# Patient Record
Sex: Female | Born: 1945
Health system: Southern US, Community
[De-identification: ages and names within clinical notes are randomized; demographics above are authoritative.]

## PROBLEM LIST (undated history)

## (undated) DIAGNOSIS — J189 Pneumonia, unspecified organism: Secondary | ICD-10-CM

## (undated) DIAGNOSIS — M1990 Unspecified osteoarthritis, unspecified site: Secondary | ICD-10-CM

## (undated) DIAGNOSIS — F32A Depression, unspecified: Secondary | ICD-10-CM

## (undated) DIAGNOSIS — C801 Malignant (primary) neoplasm, unspecified: Secondary | ICD-10-CM

## (undated) DIAGNOSIS — J45909 Unspecified asthma, uncomplicated: Secondary | ICD-10-CM

## (undated) DIAGNOSIS — R011 Cardiac murmur, unspecified: Secondary | ICD-10-CM

## (undated) HISTORY — PX: APPENDECTOMY: SHX54

## (undated) HISTORY — PX: BACK SURGERY: SHX140

## (undated) HISTORY — PX: OTHER SURGICAL HISTORY: SHX169

## (undated) HISTORY — PX: FRACTURE SURGERY: SHX138

## (undated) HISTORY — PX: FOOT SURGERY: SHX648

---

## 1996-09-12 HISTORY — PX: SEPTOPLASTY: SUR1290

## 2005-09-12 HISTORY — PX: KNEE SURGERY: SHX244

## 2013-09-16 DIAGNOSIS — M7750 Other enthesopathy of unspecified foot: Secondary | ICD-10-CM | POA: Diagnosis not present

## 2013-09-16 DIAGNOSIS — M2040 Other hammer toe(s) (acquired), unspecified foot: Secondary | ICD-10-CM | POA: Diagnosis not present

## 2013-09-16 DIAGNOSIS — M779 Enthesopathy, unspecified: Secondary | ICD-10-CM | POA: Diagnosis not present

## 2013-09-16 DIAGNOSIS — M7660 Achilles tendinitis, unspecified leg: Secondary | ICD-10-CM | POA: Diagnosis not present

## 2013-09-16 DIAGNOSIS — L738 Other specified follicular disorders: Secondary | ICD-10-CM | POA: Diagnosis not present

## 2013-09-18 DIAGNOSIS — IMO0002 Reserved for concepts with insufficient information to code with codable children: Secondary | ICD-10-CM | POA: Diagnosis not present

## 2013-09-19 DIAGNOSIS — M7750 Other enthesopathy of unspecified foot: Secondary | ICD-10-CM | POA: Diagnosis not present

## 2013-09-19 DIAGNOSIS — M779 Enthesopathy, unspecified: Secondary | ICD-10-CM | POA: Diagnosis not present

## 2013-09-23 DIAGNOSIS — M2040 Other hammer toe(s) (acquired), unspecified foot: Secondary | ICD-10-CM | POA: Diagnosis not present

## 2013-09-23 DIAGNOSIS — M7750 Other enthesopathy of unspecified foot: Secondary | ICD-10-CM | POA: Diagnosis not present

## 2013-09-23 DIAGNOSIS — M779 Enthesopathy, unspecified: Secondary | ICD-10-CM | POA: Diagnosis not present

## 2013-09-23 DIAGNOSIS — G5760 Lesion of plantar nerve, unspecified lower limb: Secondary | ICD-10-CM | POA: Diagnosis not present

## 2013-09-25 DIAGNOSIS — IMO0002 Reserved for concepts with insufficient information to code with codable children: Secondary | ICD-10-CM | POA: Diagnosis not present

## 2013-09-26 DIAGNOSIS — M542 Cervicalgia: Secondary | ICD-10-CM | POA: Diagnosis not present

## 2013-09-26 DIAGNOSIS — Z7182 Exercise counseling: Secondary | ICD-10-CM | POA: Diagnosis not present

## 2013-09-26 DIAGNOSIS — G248 Other dystonia: Secondary | ICD-10-CM | POA: Diagnosis not present

## 2013-09-26 DIAGNOSIS — M5412 Radiculopathy, cervical region: Secondary | ICD-10-CM | POA: Diagnosis not present

## 2013-09-26 DIAGNOSIS — Z79899 Other long term (current) drug therapy: Secondary | ICD-10-CM | POA: Diagnosis not present

## 2013-10-02 DIAGNOSIS — IMO0002 Reserved for concepts with insufficient information to code with codable children: Secondary | ICD-10-CM | POA: Diagnosis not present

## 2013-10-09 DIAGNOSIS — IMO0002 Reserved for concepts with insufficient information to code with codable children: Secondary | ICD-10-CM | POA: Diagnosis not present

## 2013-10-09 DIAGNOSIS — M4802 Spinal stenosis, cervical region: Secondary | ICD-10-CM | POA: Diagnosis not present

## 2013-10-14 DIAGNOSIS — M7750 Other enthesopathy of unspecified foot: Secondary | ICD-10-CM | POA: Diagnosis not present

## 2013-10-24 DIAGNOSIS — M5412 Radiculopathy, cervical region: Secondary | ICD-10-CM | POA: Diagnosis not present

## 2013-10-24 DIAGNOSIS — M542 Cervicalgia: Secondary | ICD-10-CM | POA: Diagnosis not present

## 2013-10-28 DIAGNOSIS — M7750 Other enthesopathy of unspecified foot: Secondary | ICD-10-CM | POA: Diagnosis not present

## 2013-11-05 DIAGNOSIS — C4491 Basal cell carcinoma of skin, unspecified: Secondary | ICD-10-CM | POA: Diagnosis not present

## 2013-11-05 DIAGNOSIS — Z1231 Encounter for screening mammogram for malignant neoplasm of breast: Secondary | ICD-10-CM | POA: Diagnosis not present

## 2013-11-05 DIAGNOSIS — I10 Essential (primary) hypertension: Secondary | ICD-10-CM | POA: Diagnosis not present

## 2013-11-05 DIAGNOSIS — J309 Allergic rhinitis, unspecified: Secondary | ICD-10-CM | POA: Diagnosis not present

## 2013-11-05 DIAGNOSIS — E669 Obesity, unspecified: Secondary | ICD-10-CM | POA: Diagnosis not present

## 2013-11-05 DIAGNOSIS — Z01818 Encounter for other preprocedural examination: Secondary | ICD-10-CM | POA: Diagnosis not present

## 2013-11-06 DIAGNOSIS — Z01818 Encounter for other preprocedural examination: Secondary | ICD-10-CM | POA: Diagnosis not present

## 2013-11-07 DIAGNOSIS — R9431 Abnormal electrocardiogram [ECG] [EKG]: Secondary | ICD-10-CM | POA: Diagnosis not present

## 2013-11-07 DIAGNOSIS — Z01810 Encounter for preprocedural cardiovascular examination: Secondary | ICD-10-CM | POA: Diagnosis not present

## 2013-11-07 DIAGNOSIS — I10 Essential (primary) hypertension: Secondary | ICD-10-CM | POA: Diagnosis not present

## 2013-11-11 DIAGNOSIS — G5760 Lesion of plantar nerve, unspecified lower limb: Secondary | ICD-10-CM | POA: Diagnosis not present

## 2013-11-11 DIAGNOSIS — M7750 Other enthesopathy of unspecified foot: Secondary | ICD-10-CM | POA: Diagnosis not present

## 2013-11-11 DIAGNOSIS — M779 Enthesopathy, unspecified: Secondary | ICD-10-CM | POA: Diagnosis not present

## 2013-11-11 DIAGNOSIS — M21969 Unspecified acquired deformity of unspecified lower leg: Secondary | ICD-10-CM | POA: Diagnosis not present

## 2013-11-12 DIAGNOSIS — L570 Actinic keratosis: Secondary | ICD-10-CM | POA: Diagnosis not present

## 2013-11-12 DIAGNOSIS — L578 Other skin changes due to chronic exposure to nonionizing radiation: Secondary | ICD-10-CM | POA: Diagnosis not present

## 2013-11-12 DIAGNOSIS — L565 Disseminated superficial actinic porokeratosis (DSAP): Secondary | ICD-10-CM | POA: Diagnosis not present

## 2013-11-12 DIAGNOSIS — Z85828 Personal history of other malignant neoplasm of skin: Secondary | ICD-10-CM | POA: Diagnosis not present

## 2013-11-13 DIAGNOSIS — M948X9 Other specified disorders of cartilage, unspecified sites: Secondary | ICD-10-CM | POA: Diagnosis not present

## 2013-11-13 DIAGNOSIS — M79609 Pain in unspecified limb: Secondary | ICD-10-CM | POA: Diagnosis not present

## 2013-11-13 DIAGNOSIS — M21619 Bunion of unspecified foot: Secondary | ICD-10-CM | POA: Diagnosis not present

## 2013-11-13 DIAGNOSIS — M21969 Unspecified acquired deformity of unspecified lower leg: Secondary | ICD-10-CM | POA: Diagnosis not present

## 2013-11-13 DIAGNOSIS — M898X9 Other specified disorders of bone, unspecified site: Secondary | ICD-10-CM | POA: Diagnosis not present

## 2013-11-13 DIAGNOSIS — G5790 Unspecified mononeuropathy of unspecified lower limb: Secondary | ICD-10-CM | POA: Diagnosis not present

## 2013-11-13 DIAGNOSIS — D2120 Benign neoplasm of connective and other soft tissue of unspecified lower limb, including hip: Secondary | ICD-10-CM | POA: Diagnosis not present

## 2013-11-13 DIAGNOSIS — G5760 Lesion of plantar nerve, unspecified lower limb: Secondary | ICD-10-CM | POA: Diagnosis not present

## 2013-11-13 DIAGNOSIS — M2040 Other hammer toe(s) (acquired), unspecified foot: Secondary | ICD-10-CM | POA: Diagnosis not present

## 2013-11-21 DIAGNOSIS — Z7182 Exercise counseling: Secondary | ICD-10-CM | POA: Diagnosis not present

## 2013-11-21 DIAGNOSIS — M542 Cervicalgia: Secondary | ICD-10-CM | POA: Diagnosis not present

## 2013-11-21 DIAGNOSIS — IMO0001 Reserved for inherently not codable concepts without codable children: Secondary | ICD-10-CM | POA: Diagnosis not present

## 2013-11-21 DIAGNOSIS — G248 Other dystonia: Secondary | ICD-10-CM | POA: Diagnosis not present

## 2013-11-21 DIAGNOSIS — M5412 Radiculopathy, cervical region: Secondary | ICD-10-CM | POA: Diagnosis not present

## 2013-12-19 DIAGNOSIS — IMO0001 Reserved for inherently not codable concepts without codable children: Secondary | ICD-10-CM | POA: Diagnosis not present

## 2013-12-19 DIAGNOSIS — M5412 Radiculopathy, cervical region: Secondary | ICD-10-CM | POA: Diagnosis not present

## 2013-12-19 DIAGNOSIS — G248 Other dystonia: Secondary | ICD-10-CM | POA: Diagnosis not present

## 2013-12-19 DIAGNOSIS — M542 Cervicalgia: Secondary | ICD-10-CM | POA: Diagnosis not present

## 2013-12-23 DIAGNOSIS — E669 Obesity, unspecified: Secondary | ICD-10-CM | POA: Diagnosis not present

## 2013-12-23 DIAGNOSIS — Z1231 Encounter for screening mammogram for malignant neoplasm of breast: Secondary | ICD-10-CM | POA: Diagnosis not present

## 2013-12-23 DIAGNOSIS — I10 Essential (primary) hypertension: Secondary | ICD-10-CM | POA: Diagnosis not present

## 2013-12-23 DIAGNOSIS — C4491 Basal cell carcinoma of skin, unspecified: Secondary | ICD-10-CM | POA: Diagnosis not present

## 2013-12-26 DIAGNOSIS — M25549 Pain in joints of unspecified hand: Secondary | ICD-10-CM | POA: Diagnosis not present

## 2013-12-26 DIAGNOSIS — M25539 Pain in unspecified wrist: Secondary | ICD-10-CM | POA: Diagnosis not present

## 2013-12-26 DIAGNOSIS — IMO0001 Reserved for inherently not codable concepts without codable children: Secondary | ICD-10-CM | POA: Diagnosis not present

## 2013-12-31 DIAGNOSIS — IMO0001 Reserved for inherently not codable concepts without codable children: Secondary | ICD-10-CM | POA: Diagnosis not present

## 2013-12-31 DIAGNOSIS — M25549 Pain in joints of unspecified hand: Secondary | ICD-10-CM | POA: Diagnosis not present

## 2013-12-31 DIAGNOSIS — M25539 Pain in unspecified wrist: Secondary | ICD-10-CM | POA: Diagnosis not present

## 2014-01-02 DIAGNOSIS — M25549 Pain in joints of unspecified hand: Secondary | ICD-10-CM | POA: Diagnosis not present

## 2014-01-02 DIAGNOSIS — M25539 Pain in unspecified wrist: Secondary | ICD-10-CM | POA: Diagnosis not present

## 2014-01-02 DIAGNOSIS — IMO0001 Reserved for inherently not codable concepts without codable children: Secondary | ICD-10-CM | POA: Diagnosis not present

## 2014-01-07 DIAGNOSIS — IMO0001 Reserved for inherently not codable concepts without codable children: Secondary | ICD-10-CM | POA: Diagnosis not present

## 2014-01-07 DIAGNOSIS — M25539 Pain in unspecified wrist: Secondary | ICD-10-CM | POA: Diagnosis not present

## 2014-01-07 DIAGNOSIS — M25549 Pain in joints of unspecified hand: Secondary | ICD-10-CM | POA: Diagnosis not present

## 2014-01-09 DIAGNOSIS — M25539 Pain in unspecified wrist: Secondary | ICD-10-CM | POA: Diagnosis not present

## 2014-01-09 DIAGNOSIS — IMO0001 Reserved for inherently not codable concepts without codable children: Secondary | ICD-10-CM | POA: Diagnosis not present

## 2014-01-09 DIAGNOSIS — M25549 Pain in joints of unspecified hand: Secondary | ICD-10-CM | POA: Diagnosis not present

## 2014-01-14 DIAGNOSIS — M79609 Pain in unspecified limb: Secondary | ICD-10-CM | POA: Diagnosis not present

## 2014-01-14 DIAGNOSIS — M25539 Pain in unspecified wrist: Secondary | ICD-10-CM | POA: Diagnosis not present

## 2014-01-16 DIAGNOSIS — M79609 Pain in unspecified limb: Secondary | ICD-10-CM | POA: Diagnosis not present

## 2014-01-16 DIAGNOSIS — M25539 Pain in unspecified wrist: Secondary | ICD-10-CM | POA: Diagnosis not present

## 2014-01-21 DIAGNOSIS — M79609 Pain in unspecified limb: Secondary | ICD-10-CM | POA: Diagnosis not present

## 2014-01-21 DIAGNOSIS — M25539 Pain in unspecified wrist: Secondary | ICD-10-CM | POA: Diagnosis not present

## 2014-01-23 DIAGNOSIS — M79609 Pain in unspecified limb: Secondary | ICD-10-CM | POA: Diagnosis not present

## 2014-01-23 DIAGNOSIS — M25539 Pain in unspecified wrist: Secondary | ICD-10-CM | POA: Diagnosis not present

## 2014-01-28 DIAGNOSIS — M79609 Pain in unspecified limb: Secondary | ICD-10-CM | POA: Diagnosis not present

## 2014-01-28 DIAGNOSIS — M25539 Pain in unspecified wrist: Secondary | ICD-10-CM | POA: Diagnosis not present

## 2014-01-30 DIAGNOSIS — M542 Cervicalgia: Secondary | ICD-10-CM | POA: Diagnosis not present

## 2014-01-30 DIAGNOSIS — Z7182 Exercise counseling: Secondary | ICD-10-CM | POA: Diagnosis not present

## 2014-01-30 DIAGNOSIS — G5600 Carpal tunnel syndrome, unspecified upper limb: Secondary | ICD-10-CM | POA: Diagnosis not present

## 2014-01-30 DIAGNOSIS — M5412 Radiculopathy, cervical region: Secondary | ICD-10-CM | POA: Diagnosis not present

## 2014-01-30 DIAGNOSIS — M25519 Pain in unspecified shoulder: Secondary | ICD-10-CM | POA: Diagnosis not present

## 2014-02-11 DIAGNOSIS — M549 Dorsalgia, unspecified: Secondary | ICD-10-CM | POA: Diagnosis not present

## 2014-02-11 DIAGNOSIS — IMO0001 Reserved for inherently not codable concepts without codable children: Secondary | ICD-10-CM | POA: Diagnosis not present

## 2014-02-11 DIAGNOSIS — M7750 Other enthesopathy of unspecified foot: Secondary | ICD-10-CM | POA: Diagnosis not present

## 2014-02-11 DIAGNOSIS — M25519 Pain in unspecified shoulder: Secondary | ICD-10-CM | POA: Diagnosis not present

## 2014-02-11 DIAGNOSIS — M542 Cervicalgia: Secondary | ICD-10-CM | POA: Diagnosis not present

## 2014-02-12 DIAGNOSIS — M25519 Pain in unspecified shoulder: Secondary | ICD-10-CM | POA: Diagnosis not present

## 2014-02-12 DIAGNOSIS — M25819 Other specified joint disorders, unspecified shoulder: Secondary | ICD-10-CM | POA: Diagnosis not present

## 2014-02-13 DIAGNOSIS — M549 Dorsalgia, unspecified: Secondary | ICD-10-CM | POA: Diagnosis not present

## 2014-02-13 DIAGNOSIS — M7750 Other enthesopathy of unspecified foot: Secondary | ICD-10-CM | POA: Diagnosis not present

## 2014-02-13 DIAGNOSIS — M25519 Pain in unspecified shoulder: Secondary | ICD-10-CM | POA: Diagnosis not present

## 2014-02-13 DIAGNOSIS — IMO0001 Reserved for inherently not codable concepts without codable children: Secondary | ICD-10-CM | POA: Diagnosis not present

## 2014-02-13 DIAGNOSIS — M542 Cervicalgia: Secondary | ICD-10-CM | POA: Diagnosis not present

## 2014-02-18 DIAGNOSIS — M542 Cervicalgia: Secondary | ICD-10-CM | POA: Diagnosis not present

## 2014-02-18 DIAGNOSIS — Z7182 Exercise counseling: Secondary | ICD-10-CM | POA: Diagnosis not present

## 2014-02-18 DIAGNOSIS — M25519 Pain in unspecified shoulder: Secondary | ICD-10-CM | POA: Diagnosis not present

## 2014-02-18 DIAGNOSIS — G5600 Carpal tunnel syndrome, unspecified upper limb: Secondary | ICD-10-CM | POA: Diagnosis not present

## 2014-02-18 DIAGNOSIS — M13819 Other specified arthritis, unspecified shoulder: Secondary | ICD-10-CM | POA: Diagnosis not present

## 2014-02-18 DIAGNOSIS — M7750 Other enthesopathy of unspecified foot: Secondary | ICD-10-CM | POA: Diagnosis not present

## 2014-02-19 DIAGNOSIS — M25519 Pain in unspecified shoulder: Secondary | ICD-10-CM | POA: Diagnosis not present

## 2014-02-19 DIAGNOSIS — M549 Dorsalgia, unspecified: Secondary | ICD-10-CM | POA: Diagnosis not present

## 2014-02-19 DIAGNOSIS — IMO0001 Reserved for inherently not codable concepts without codable children: Secondary | ICD-10-CM | POA: Diagnosis not present

## 2014-02-19 DIAGNOSIS — M542 Cervicalgia: Secondary | ICD-10-CM | POA: Diagnosis not present

## 2014-02-25 DIAGNOSIS — IMO0001 Reserved for inherently not codable concepts without codable children: Secondary | ICD-10-CM | POA: Diagnosis not present

## 2014-02-25 DIAGNOSIS — M542 Cervicalgia: Secondary | ICD-10-CM | POA: Diagnosis not present

## 2014-02-25 DIAGNOSIS — M25519 Pain in unspecified shoulder: Secondary | ICD-10-CM | POA: Diagnosis not present

## 2014-02-25 DIAGNOSIS — M7750 Other enthesopathy of unspecified foot: Secondary | ICD-10-CM | POA: Diagnosis not present

## 2014-02-25 DIAGNOSIS — M549 Dorsalgia, unspecified: Secondary | ICD-10-CM | POA: Diagnosis not present

## 2014-02-26 DIAGNOSIS — M542 Cervicalgia: Secondary | ICD-10-CM | POA: Diagnosis not present

## 2014-02-26 DIAGNOSIS — M25519 Pain in unspecified shoulder: Secondary | ICD-10-CM | POA: Diagnosis not present

## 2014-02-26 DIAGNOSIS — IMO0001 Reserved for inherently not codable concepts without codable children: Secondary | ICD-10-CM | POA: Diagnosis not present

## 2014-02-26 DIAGNOSIS — M549 Dorsalgia, unspecified: Secondary | ICD-10-CM | POA: Diagnosis not present

## 2014-02-27 DIAGNOSIS — M7750 Other enthesopathy of unspecified foot: Secondary | ICD-10-CM | POA: Diagnosis not present

## 2014-02-27 DIAGNOSIS — M549 Dorsalgia, unspecified: Secondary | ICD-10-CM | POA: Diagnosis not present

## 2014-02-27 DIAGNOSIS — M542 Cervicalgia: Secondary | ICD-10-CM | POA: Diagnosis not present

## 2014-02-27 DIAGNOSIS — IMO0001 Reserved for inherently not codable concepts without codable children: Secondary | ICD-10-CM | POA: Diagnosis not present

## 2014-02-27 DIAGNOSIS — M25519 Pain in unspecified shoulder: Secondary | ICD-10-CM | POA: Diagnosis not present

## 2014-02-28 DIAGNOSIS — M542 Cervicalgia: Secondary | ICD-10-CM | POA: Diagnosis not present

## 2014-02-28 DIAGNOSIS — M25519 Pain in unspecified shoulder: Secondary | ICD-10-CM | POA: Diagnosis not present

## 2014-02-28 DIAGNOSIS — IMO0001 Reserved for inherently not codable concepts without codable children: Secondary | ICD-10-CM | POA: Diagnosis not present

## 2014-02-28 DIAGNOSIS — M549 Dorsalgia, unspecified: Secondary | ICD-10-CM | POA: Diagnosis not present

## 2014-03-04 DIAGNOSIS — M7750 Other enthesopathy of unspecified foot: Secondary | ICD-10-CM | POA: Diagnosis not present

## 2014-03-05 DIAGNOSIS — M542 Cervicalgia: Secondary | ICD-10-CM | POA: Diagnosis not present

## 2014-03-05 DIAGNOSIS — M25519 Pain in unspecified shoulder: Secondary | ICD-10-CM | POA: Diagnosis not present

## 2014-03-05 DIAGNOSIS — IMO0001 Reserved for inherently not codable concepts without codable children: Secondary | ICD-10-CM | POA: Diagnosis not present

## 2014-03-05 DIAGNOSIS — M549 Dorsalgia, unspecified: Secondary | ICD-10-CM | POA: Diagnosis not present

## 2014-03-06 DIAGNOSIS — L820 Inflamed seborrheic keratosis: Secondary | ICD-10-CM | POA: Diagnosis not present

## 2014-03-06 DIAGNOSIS — D2360 Other benign neoplasm of skin of unspecified upper limb, including shoulder: Secondary | ICD-10-CM | POA: Diagnosis not present

## 2014-03-06 DIAGNOSIS — Z85828 Personal history of other malignant neoplasm of skin: Secondary | ICD-10-CM | POA: Diagnosis not present

## 2014-03-06 DIAGNOSIS — D485 Neoplasm of uncertain behavior of skin: Secondary | ICD-10-CM | POA: Diagnosis not present

## 2014-03-06 DIAGNOSIS — D235 Other benign neoplasm of skin of trunk: Secondary | ICD-10-CM | POA: Diagnosis not present

## 2014-03-06 DIAGNOSIS — L570 Actinic keratosis: Secondary | ICD-10-CM | POA: Diagnosis not present

## 2014-03-06 DIAGNOSIS — M7750 Other enthesopathy of unspecified foot: Secondary | ICD-10-CM | POA: Diagnosis not present

## 2014-03-11 DIAGNOSIS — L738 Other specified follicular disorders: Secondary | ICD-10-CM | POA: Diagnosis not present

## 2014-03-11 DIAGNOSIS — M2040 Other hammer toe(s) (acquired), unspecified foot: Secondary | ICD-10-CM | POA: Diagnosis not present

## 2014-03-11 DIAGNOSIS — L600 Ingrowing nail: Secondary | ICD-10-CM | POA: Diagnosis not present

## 2014-03-11 DIAGNOSIS — M25519 Pain in unspecified shoulder: Secondary | ICD-10-CM | POA: Diagnosis not present

## 2014-03-11 DIAGNOSIS — M7750 Other enthesopathy of unspecified foot: Secondary | ICD-10-CM | POA: Diagnosis not present

## 2014-03-11 DIAGNOSIS — IMO0001 Reserved for inherently not codable concepts without codable children: Secondary | ICD-10-CM | POA: Diagnosis not present

## 2014-03-11 DIAGNOSIS — M542 Cervicalgia: Secondary | ICD-10-CM | POA: Diagnosis not present

## 2014-03-11 DIAGNOSIS — M549 Dorsalgia, unspecified: Secondary | ICD-10-CM | POA: Diagnosis not present

## 2014-03-13 DIAGNOSIS — M549 Dorsalgia, unspecified: Secondary | ICD-10-CM | POA: Diagnosis not present

## 2014-03-13 DIAGNOSIS — M25519 Pain in unspecified shoulder: Secondary | ICD-10-CM | POA: Diagnosis not present

## 2014-03-13 DIAGNOSIS — IMO0001 Reserved for inherently not codable concepts without codable children: Secondary | ICD-10-CM | POA: Diagnosis not present

## 2014-03-13 DIAGNOSIS — M542 Cervicalgia: Secondary | ICD-10-CM | POA: Diagnosis not present

## 2014-03-18 DIAGNOSIS — M7750 Other enthesopathy of unspecified foot: Secondary | ICD-10-CM | POA: Diagnosis not present

## 2014-03-18 DIAGNOSIS — M25519 Pain in unspecified shoulder: Secondary | ICD-10-CM | POA: Diagnosis not present

## 2014-03-18 DIAGNOSIS — M549 Dorsalgia, unspecified: Secondary | ICD-10-CM | POA: Diagnosis not present

## 2014-03-18 DIAGNOSIS — IMO0001 Reserved for inherently not codable concepts without codable children: Secondary | ICD-10-CM | POA: Diagnosis not present

## 2014-03-18 DIAGNOSIS — M542 Cervicalgia: Secondary | ICD-10-CM | POA: Diagnosis not present

## 2014-03-19 DIAGNOSIS — I10 Essential (primary) hypertension: Secondary | ICD-10-CM | POA: Diagnosis not present

## 2014-03-20 DIAGNOSIS — M549 Dorsalgia, unspecified: Secondary | ICD-10-CM | POA: Diagnosis not present

## 2014-03-20 DIAGNOSIS — M25519 Pain in unspecified shoulder: Secondary | ICD-10-CM | POA: Diagnosis not present

## 2014-03-20 DIAGNOSIS — M542 Cervicalgia: Secondary | ICD-10-CM | POA: Diagnosis not present

## 2014-03-20 DIAGNOSIS — IMO0001 Reserved for inherently not codable concepts without codable children: Secondary | ICD-10-CM | POA: Diagnosis not present

## 2014-03-25 DIAGNOSIS — Z1231 Encounter for screening mammogram for malignant neoplasm of breast: Secondary | ICD-10-CM | POA: Diagnosis not present

## 2014-03-25 DIAGNOSIS — M7750 Other enthesopathy of unspecified foot: Secondary | ICD-10-CM | POA: Diagnosis not present

## 2014-03-25 DIAGNOSIS — I10 Essential (primary) hypertension: Secondary | ICD-10-CM | POA: Diagnosis not present

## 2014-03-25 DIAGNOSIS — C4491 Basal cell carcinoma of skin, unspecified: Secondary | ICD-10-CM | POA: Diagnosis not present

## 2014-03-25 DIAGNOSIS — E669 Obesity, unspecified: Secondary | ICD-10-CM | POA: Diagnosis not present

## 2014-03-26 DIAGNOSIS — M25519 Pain in unspecified shoulder: Secondary | ICD-10-CM | POA: Diagnosis not present

## 2014-03-26 DIAGNOSIS — M549 Dorsalgia, unspecified: Secondary | ICD-10-CM | POA: Diagnosis not present

## 2014-03-26 DIAGNOSIS — M542 Cervicalgia: Secondary | ICD-10-CM | POA: Diagnosis not present

## 2014-03-26 DIAGNOSIS — IMO0001 Reserved for inherently not codable concepts without codable children: Secondary | ICD-10-CM | POA: Diagnosis not present

## 2014-04-10 DIAGNOSIS — M7750 Other enthesopathy of unspecified foot: Secondary | ICD-10-CM | POA: Diagnosis not present

## 2014-04-10 DIAGNOSIS — L905 Scar conditions and fibrosis of skin: Secondary | ICD-10-CM | POA: Diagnosis not present

## 2014-04-10 DIAGNOSIS — L738 Other specified follicular disorders: Secondary | ICD-10-CM | POA: Diagnosis not present

## 2014-04-10 DIAGNOSIS — M21969 Unspecified acquired deformity of unspecified lower leg: Secondary | ICD-10-CM | POA: Diagnosis not present

## 2014-04-10 DIAGNOSIS — M21619 Bunion of unspecified foot: Secondary | ICD-10-CM | POA: Diagnosis not present

## 2014-04-12 DIAGNOSIS — L049 Acute lymphadenitis, unspecified: Secondary | ICD-10-CM | POA: Diagnosis not present

## 2014-04-12 DIAGNOSIS — J209 Acute bronchitis, unspecified: Secondary | ICD-10-CM | POA: Diagnosis not present

## 2014-04-12 DIAGNOSIS — J020 Streptococcal pharyngitis: Secondary | ICD-10-CM | POA: Diagnosis not present

## 2014-05-08 DIAGNOSIS — M779 Enthesopathy, unspecified: Secondary | ICD-10-CM | POA: Diagnosis not present

## 2014-05-20 DIAGNOSIS — M7750 Other enthesopathy of unspecified foot: Secondary | ICD-10-CM | POA: Diagnosis not present

## 2014-05-20 DIAGNOSIS — M2040 Other hammer toe(s) (acquired), unspecified foot: Secondary | ICD-10-CM | POA: Diagnosis not present

## 2014-05-20 DIAGNOSIS — L905 Scar conditions and fibrosis of skin: Secondary | ICD-10-CM | POA: Diagnosis not present

## 2014-05-20 DIAGNOSIS — M779 Enthesopathy, unspecified: Secondary | ICD-10-CM | POA: Diagnosis not present

## 2014-06-03 DIAGNOSIS — M7750 Other enthesopathy of unspecified foot: Secondary | ICD-10-CM | POA: Diagnosis not present

## 2014-06-03 DIAGNOSIS — L905 Scar conditions and fibrosis of skin: Secondary | ICD-10-CM | POA: Diagnosis not present

## 2014-06-03 DIAGNOSIS — M779 Enthesopathy, unspecified: Secondary | ICD-10-CM | POA: Diagnosis not present

## 2014-06-03 DIAGNOSIS — M2040 Other hammer toe(s) (acquired), unspecified foot: Secondary | ICD-10-CM | POA: Diagnosis not present

## 2014-06-05 DIAGNOSIS — L578 Other skin changes due to chronic exposure to nonionizing radiation: Secondary | ICD-10-CM | POA: Diagnosis not present

## 2014-06-05 DIAGNOSIS — L821 Other seborrheic keratosis: Secondary | ICD-10-CM | POA: Diagnosis not present

## 2014-06-05 DIAGNOSIS — Z85828 Personal history of other malignant neoplasm of skin: Secondary | ICD-10-CM | POA: Diagnosis not present

## 2014-06-05 DIAGNOSIS — L565 Disseminated superficial actinic porokeratosis (DSAP): Secondary | ICD-10-CM | POA: Diagnosis not present

## 2014-06-05 DIAGNOSIS — L570 Actinic keratosis: Secondary | ICD-10-CM | POA: Diagnosis not present

## 2014-07-17 DIAGNOSIS — L901 Anetoderma of Schweninger-Buzzi: Secondary | ICD-10-CM | POA: Diagnosis not present

## 2014-07-17 DIAGNOSIS — M7751 Other enthesopathy of right foot: Secondary | ICD-10-CM | POA: Diagnosis not present

## 2014-07-17 DIAGNOSIS — S96211A Strain of intrinsic muscle and tendon at ankle and foot level, right foot, initial encounter: Secondary | ICD-10-CM | POA: Diagnosis not present

## 2014-07-17 DIAGNOSIS — M779 Enthesopathy, unspecified: Secondary | ICD-10-CM | POA: Diagnosis not present

## 2014-07-17 DIAGNOSIS — L600 Ingrowing nail: Secondary | ICD-10-CM | POA: Diagnosis not present

## 2014-07-21 DIAGNOSIS — L570 Actinic keratosis: Secondary | ICD-10-CM | POA: Diagnosis not present

## 2014-08-01 DIAGNOSIS — H04123 Dry eye syndrome of bilateral lacrimal glands: Secondary | ICD-10-CM | POA: Diagnosis not present

## 2014-08-01 DIAGNOSIS — H26493 Other secondary cataract, bilateral: Secondary | ICD-10-CM | POA: Diagnosis not present

## 2014-08-12 DIAGNOSIS — M7751 Other enthesopathy of right foot: Secondary | ICD-10-CM | POA: Diagnosis not present

## 2014-08-12 DIAGNOSIS — L600 Ingrowing nail: Secondary | ICD-10-CM | POA: Diagnosis not present

## 2014-08-12 DIAGNOSIS — M779 Enthesopathy, unspecified: Secondary | ICD-10-CM | POA: Diagnosis not present

## 2014-08-12 DIAGNOSIS — M2041 Other hammer toe(s) (acquired), right foot: Secondary | ICD-10-CM | POA: Diagnosis not present

## 2014-08-12 DIAGNOSIS — L853 Xerosis cutis: Secondary | ICD-10-CM | POA: Diagnosis not present

## 2014-08-12 DIAGNOSIS — L901 Anetoderma of Schweninger-Buzzi: Secondary | ICD-10-CM | POA: Diagnosis not present

## 2014-08-20 DIAGNOSIS — L578 Other skin changes due to chronic exposure to nonionizing radiation: Secondary | ICD-10-CM | POA: Diagnosis not present

## 2014-08-20 DIAGNOSIS — Z85828 Personal history of other malignant neoplasm of skin: Secondary | ICD-10-CM | POA: Diagnosis not present

## 2014-08-20 DIAGNOSIS — L565 Disseminated superficial actinic porokeratosis (DSAP): Secondary | ICD-10-CM | POA: Diagnosis not present

## 2014-08-20 DIAGNOSIS — L720 Epidermal cyst: Secondary | ICD-10-CM | POA: Diagnosis not present

## 2014-08-20 DIAGNOSIS — X32XXXD Exposure to sunlight, subsequent encounter: Secondary | ICD-10-CM | POA: Diagnosis not present

## 2014-08-20 DIAGNOSIS — L729 Follicular cyst of the skin and subcutaneous tissue, unspecified: Secondary | ICD-10-CM | POA: Diagnosis not present

## 2014-08-22 DIAGNOSIS — H26493 Other secondary cataract, bilateral: Secondary | ICD-10-CM | POA: Diagnosis not present

## 2014-08-22 DIAGNOSIS — H04123 Dry eye syndrome of bilateral lacrimal glands: Secondary | ICD-10-CM | POA: Diagnosis not present

## 2014-09-22 DIAGNOSIS — M67471 Ganglion, right ankle and foot: Secondary | ICD-10-CM | POA: Diagnosis not present

## 2014-09-22 DIAGNOSIS — L853 Xerosis cutis: Secondary | ICD-10-CM | POA: Diagnosis not present

## 2014-09-22 DIAGNOSIS — M779 Enthesopathy, unspecified: Secondary | ICD-10-CM | POA: Diagnosis not present

## 2014-09-22 DIAGNOSIS — X32XXXA Exposure to sunlight, initial encounter: Secondary | ICD-10-CM | POA: Diagnosis not present

## 2014-09-22 DIAGNOSIS — M7751 Other enthesopathy of right foot: Secondary | ICD-10-CM | POA: Diagnosis not present

## 2014-09-22 DIAGNOSIS — L570 Actinic keratosis: Secondary | ICD-10-CM | POA: Diagnosis not present

## 2014-09-30 DIAGNOSIS — M7751 Other enthesopathy of right foot: Secondary | ICD-10-CM | POA: Diagnosis not present

## 2014-09-30 DIAGNOSIS — M67471 Ganglion, right ankle and foot: Secondary | ICD-10-CM | POA: Diagnosis not present

## 2014-09-30 DIAGNOSIS — L853 Xerosis cutis: Secondary | ICD-10-CM | POA: Diagnosis not present

## 2014-09-30 DIAGNOSIS — M779 Enthesopathy, unspecified: Secondary | ICD-10-CM | POA: Diagnosis not present

## 2014-10-28 DIAGNOSIS — M67471 Ganglion, right ankle and foot: Secondary | ICD-10-CM | POA: Diagnosis not present

## 2014-10-28 DIAGNOSIS — M7751 Other enthesopathy of right foot: Secondary | ICD-10-CM | POA: Diagnosis not present

## 2014-10-28 DIAGNOSIS — M779 Enthesopathy, unspecified: Secondary | ICD-10-CM | POA: Diagnosis not present

## 2014-10-28 DIAGNOSIS — L853 Xerosis cutis: Secondary | ICD-10-CM | POA: Diagnosis not present

## 2014-11-14 DIAGNOSIS — M67471 Ganglion, right ankle and foot: Secondary | ICD-10-CM | POA: Diagnosis not present

## 2014-11-14 DIAGNOSIS — L853 Xerosis cutis: Secondary | ICD-10-CM | POA: Diagnosis not present

## 2014-11-14 DIAGNOSIS — M7751 Other enthesopathy of right foot: Secondary | ICD-10-CM | POA: Diagnosis not present

## 2014-11-14 DIAGNOSIS — M779 Enthesopathy, unspecified: Secondary | ICD-10-CM | POA: Diagnosis not present

## 2014-12-01 DIAGNOSIS — M9904 Segmental and somatic dysfunction of sacral region: Secondary | ICD-10-CM | POA: Diagnosis not present

## 2014-12-01 DIAGNOSIS — M545 Low back pain: Secondary | ICD-10-CM | POA: Diagnosis not present

## 2014-12-01 DIAGNOSIS — M9901 Segmental and somatic dysfunction of cervical region: Secondary | ICD-10-CM | POA: Diagnosis not present

## 2014-12-01 DIAGNOSIS — M5412 Radiculopathy, cervical region: Secondary | ICD-10-CM | POA: Diagnosis not present

## 2014-12-03 DIAGNOSIS — M9904 Segmental and somatic dysfunction of sacral region: Secondary | ICD-10-CM | POA: Diagnosis not present

## 2014-12-03 DIAGNOSIS — M545 Low back pain: Secondary | ICD-10-CM | POA: Diagnosis not present

## 2014-12-03 DIAGNOSIS — M5412 Radiculopathy, cervical region: Secondary | ICD-10-CM | POA: Diagnosis not present

## 2014-12-03 DIAGNOSIS — M9901 Segmental and somatic dysfunction of cervical region: Secondary | ICD-10-CM | POA: Diagnosis not present

## 2014-12-05 DIAGNOSIS — M779 Enthesopathy, unspecified: Secondary | ICD-10-CM | POA: Diagnosis not present

## 2014-12-05 DIAGNOSIS — M67471 Ganglion, right ankle and foot: Secondary | ICD-10-CM | POA: Diagnosis not present

## 2014-12-05 DIAGNOSIS — L853 Xerosis cutis: Secondary | ICD-10-CM | POA: Diagnosis not present

## 2014-12-05 DIAGNOSIS — M7751 Other enthesopathy of right foot: Secondary | ICD-10-CM | POA: Diagnosis not present

## 2014-12-08 DIAGNOSIS — M9904 Segmental and somatic dysfunction of sacral region: Secondary | ICD-10-CM | POA: Diagnosis not present

## 2014-12-08 DIAGNOSIS — M9901 Segmental and somatic dysfunction of cervical region: Secondary | ICD-10-CM | POA: Diagnosis not present

## 2014-12-08 DIAGNOSIS — M5412 Radiculopathy, cervical region: Secondary | ICD-10-CM | POA: Diagnosis not present

## 2014-12-08 DIAGNOSIS — M545 Low back pain: Secondary | ICD-10-CM | POA: Diagnosis not present

## 2014-12-22 DIAGNOSIS — L570 Actinic keratosis: Secondary | ICD-10-CM | POA: Diagnosis not present

## 2014-12-22 DIAGNOSIS — L578 Other skin changes due to chronic exposure to nonionizing radiation: Secondary | ICD-10-CM | POA: Diagnosis not present

## 2014-12-22 DIAGNOSIS — X32XXXA Exposure to sunlight, initial encounter: Secondary | ICD-10-CM | POA: Diagnosis not present

## 2014-12-22 DIAGNOSIS — X32XXXD Exposure to sunlight, subsequent encounter: Secondary | ICD-10-CM | POA: Diagnosis not present

## 2014-12-22 DIAGNOSIS — L565 Disseminated superficial actinic porokeratosis (DSAP): Secondary | ICD-10-CM | POA: Diagnosis not present

## 2014-12-22 DIAGNOSIS — Z85828 Personal history of other malignant neoplasm of skin: Secondary | ICD-10-CM | POA: Diagnosis not present

## 2014-12-26 DIAGNOSIS — M67471 Ganglion, right ankle and foot: Secondary | ICD-10-CM | POA: Diagnosis not present

## 2014-12-26 DIAGNOSIS — L853 Xerosis cutis: Secondary | ICD-10-CM | POA: Diagnosis not present

## 2014-12-26 DIAGNOSIS — M779 Enthesopathy, unspecified: Secondary | ICD-10-CM | POA: Diagnosis not present

## 2014-12-26 DIAGNOSIS — M7751 Other enthesopathy of right foot: Secondary | ICD-10-CM | POA: Diagnosis not present

## 2015-01-06 DIAGNOSIS — L570 Actinic keratosis: Secondary | ICD-10-CM | POA: Diagnosis not present

## 2015-01-06 DIAGNOSIS — X32XXXA Exposure to sunlight, initial encounter: Secondary | ICD-10-CM | POA: Diagnosis not present

## 2015-01-08 DIAGNOSIS — L853 Xerosis cutis: Secondary | ICD-10-CM | POA: Diagnosis not present

## 2015-01-08 DIAGNOSIS — M67471 Ganglion, right ankle and foot: Secondary | ICD-10-CM | POA: Diagnosis not present

## 2015-01-08 DIAGNOSIS — M7751 Other enthesopathy of right foot: Secondary | ICD-10-CM | POA: Diagnosis not present

## 2015-01-08 DIAGNOSIS — M779 Enthesopathy, unspecified: Secondary | ICD-10-CM | POA: Diagnosis not present

## 2015-01-27 DIAGNOSIS — M7751 Other enthesopathy of right foot: Secondary | ICD-10-CM | POA: Diagnosis not present

## 2015-01-27 DIAGNOSIS — M7752 Other enthesopathy of left foot: Secondary | ICD-10-CM | POA: Diagnosis not present

## 2015-01-27 DIAGNOSIS — L853 Xerosis cutis: Secondary | ICD-10-CM | POA: Diagnosis not present

## 2015-01-27 DIAGNOSIS — M779 Enthesopathy, unspecified: Secondary | ICD-10-CM | POA: Diagnosis not present

## 2015-01-27 DIAGNOSIS — L858 Other specified epidermal thickening: Secondary | ICD-10-CM | POA: Diagnosis not present

## 2015-02-12 DIAGNOSIS — M779 Enthesopathy, unspecified: Secondary | ICD-10-CM | POA: Diagnosis not present

## 2015-02-12 DIAGNOSIS — L853 Xerosis cutis: Secondary | ICD-10-CM | POA: Diagnosis not present

## 2015-02-12 DIAGNOSIS — M7751 Other enthesopathy of right foot: Secondary | ICD-10-CM | POA: Diagnosis not present

## 2015-02-12 DIAGNOSIS — M7752 Other enthesopathy of left foot: Secondary | ICD-10-CM | POA: Diagnosis not present

## 2015-02-12 DIAGNOSIS — L600 Ingrowing nail: Secondary | ICD-10-CM | POA: Diagnosis not present

## 2015-02-12 DIAGNOSIS — L858 Other specified epidermal thickening: Secondary | ICD-10-CM | POA: Diagnosis not present

## 2015-02-17 DIAGNOSIS — L259 Unspecified contact dermatitis, unspecified cause: Secondary | ICD-10-CM | POA: Diagnosis not present

## 2015-02-17 DIAGNOSIS — X32XXXD Exposure to sunlight, subsequent encounter: Secondary | ICD-10-CM | POA: Diagnosis not present

## 2015-02-17 DIAGNOSIS — X32XXXA Exposure to sunlight, initial encounter: Secondary | ICD-10-CM | POA: Diagnosis not present

## 2015-02-17 DIAGNOSIS — L570 Actinic keratosis: Secondary | ICD-10-CM | POA: Diagnosis not present

## 2015-02-17 DIAGNOSIS — L565 Disseminated superficial actinic porokeratosis (DSAP): Secondary | ICD-10-CM | POA: Diagnosis not present

## 2015-02-17 DIAGNOSIS — L578 Other skin changes due to chronic exposure to nonionizing radiation: Secondary | ICD-10-CM | POA: Diagnosis not present

## 2015-02-23 DIAGNOSIS — M7752 Other enthesopathy of left foot: Secondary | ICD-10-CM | POA: Diagnosis not present

## 2015-02-23 DIAGNOSIS — L858 Other specified epidermal thickening: Secondary | ICD-10-CM | POA: Diagnosis not present

## 2015-02-23 DIAGNOSIS — L600 Ingrowing nail: Secondary | ICD-10-CM | POA: Diagnosis not present

## 2015-02-23 DIAGNOSIS — M779 Enthesopathy, unspecified: Secondary | ICD-10-CM | POA: Diagnosis not present

## 2015-02-23 DIAGNOSIS — M7751 Other enthesopathy of right foot: Secondary | ICD-10-CM | POA: Diagnosis not present

## 2015-02-23 DIAGNOSIS — L853 Xerosis cutis: Secondary | ICD-10-CM | POA: Diagnosis not present

## 2015-04-16 DIAGNOSIS — L853 Xerosis cutis: Secondary | ICD-10-CM | POA: Diagnosis not present

## 2015-04-16 DIAGNOSIS — L600 Ingrowing nail: Secondary | ICD-10-CM | POA: Diagnosis not present

## 2015-04-16 DIAGNOSIS — M7751 Other enthesopathy of right foot: Secondary | ICD-10-CM | POA: Diagnosis not present

## 2015-04-16 DIAGNOSIS — M779 Enthesopathy, unspecified: Secondary | ICD-10-CM | POA: Diagnosis not present

## 2015-04-16 DIAGNOSIS — M7752 Other enthesopathy of left foot: Secondary | ICD-10-CM | POA: Diagnosis not present

## 2015-04-16 DIAGNOSIS — L858 Other specified epidermal thickening: Secondary | ICD-10-CM | POA: Diagnosis not present

## 2015-08-10 DIAGNOSIS — J019 Acute sinusitis, unspecified: Secondary | ICD-10-CM | POA: Diagnosis not present

## 2015-08-10 DIAGNOSIS — J22 Unspecified acute lower respiratory infection: Secondary | ICD-10-CM | POA: Diagnosis not present

## 2015-08-10 DIAGNOSIS — R0602 Shortness of breath: Secondary | ICD-10-CM | POA: Diagnosis not present

## 2015-08-10 DIAGNOSIS — B9689 Other specified bacterial agents as the cause of diseases classified elsewhere: Secondary | ICD-10-CM | POA: Diagnosis not present

## 2015-08-14 DIAGNOSIS — M7752 Other enthesopathy of left foot: Secondary | ICD-10-CM | POA: Diagnosis not present

## 2015-08-14 DIAGNOSIS — M2041 Other hammer toe(s) (acquired), right foot: Secondary | ICD-10-CM | POA: Diagnosis not present

## 2015-08-14 DIAGNOSIS — M1990 Unspecified osteoarthritis, unspecified site: Secondary | ICD-10-CM | POA: Diagnosis not present

## 2015-08-14 DIAGNOSIS — M19072 Primary osteoarthritis, left ankle and foot: Secondary | ICD-10-CM | POA: Diagnosis not present

## 2015-08-14 DIAGNOSIS — M659 Synovitis and tenosynovitis, unspecified: Secondary | ICD-10-CM | POA: Diagnosis not present

## 2015-08-17 DIAGNOSIS — J019 Acute sinusitis, unspecified: Secondary | ICD-10-CM | POA: Diagnosis not present

## 2015-08-17 DIAGNOSIS — J22 Unspecified acute lower respiratory infection: Secondary | ICD-10-CM | POA: Diagnosis not present

## 2015-08-17 DIAGNOSIS — B9689 Other specified bacterial agents as the cause of diseases classified elsewhere: Secondary | ICD-10-CM | POA: Diagnosis not present

## 2015-08-17 DIAGNOSIS — M7752 Other enthesopathy of left foot: Secondary | ICD-10-CM | POA: Diagnosis not present

## 2015-08-17 DIAGNOSIS — M10072 Idiopathic gout, left ankle and foot: Secondary | ICD-10-CM | POA: Diagnosis not present

## 2015-09-01 DIAGNOSIS — R05 Cough: Secondary | ICD-10-CM | POA: Diagnosis not present

## 2015-09-01 DIAGNOSIS — J988 Other specified respiratory disorders: Secondary | ICD-10-CM | POA: Diagnosis not present

## 2015-09-18 DIAGNOSIS — M109 Gout, unspecified: Secondary | ICD-10-CM | POA: Diagnosis not present

## 2015-09-18 DIAGNOSIS — R5383 Other fatigue: Secondary | ICD-10-CM | POA: Diagnosis not present

## 2015-09-18 DIAGNOSIS — R030 Elevated blood-pressure reading, without diagnosis of hypertension: Secondary | ICD-10-CM | POA: Diagnosis not present

## 2015-09-18 DIAGNOSIS — R7989 Other specified abnormal findings of blood chemistry: Secondary | ICD-10-CM | POA: Diagnosis not present

## 2015-09-18 DIAGNOSIS — F331 Major depressive disorder, recurrent, moderate: Secondary | ICD-10-CM | POA: Diagnosis not present

## 2015-09-23 DIAGNOSIS — F331 Major depressive disorder, recurrent, moderate: Secondary | ICD-10-CM | POA: Diagnosis not present

## 2015-09-23 DIAGNOSIS — H6121 Impacted cerumen, right ear: Secondary | ICD-10-CM | POA: Diagnosis not present

## 2015-09-23 DIAGNOSIS — R030 Elevated blood-pressure reading, without diagnosis of hypertension: Secondary | ICD-10-CM | POA: Diagnosis not present

## 2015-09-23 DIAGNOSIS — Z23 Encounter for immunization: Secondary | ICD-10-CM | POA: Diagnosis not present

## 2015-11-02 DIAGNOSIS — C4492 Squamous cell carcinoma of skin, unspecified: Secondary | ICD-10-CM | POA: Diagnosis not present

## 2015-11-02 DIAGNOSIS — M7541 Impingement syndrome of right shoulder: Secondary | ICD-10-CM | POA: Diagnosis not present

## 2015-11-02 DIAGNOSIS — F331 Major depressive disorder, recurrent, moderate: Secondary | ICD-10-CM | POA: Diagnosis not present

## 2015-11-10 DIAGNOSIS — L565 Disseminated superficial actinic porokeratosis (DSAP): Secondary | ICD-10-CM | POA: Diagnosis not present

## 2015-11-10 DIAGNOSIS — L821 Other seborrheic keratosis: Secondary | ICD-10-CM | POA: Diagnosis not present

## 2015-11-10 DIAGNOSIS — L570 Actinic keratosis: Secondary | ICD-10-CM | POA: Diagnosis not present

## 2015-11-10 DIAGNOSIS — D1800 Hemangioma unspecified site: Secondary | ICD-10-CM | POA: Diagnosis not present

## 2015-11-10 DIAGNOSIS — Z23 Encounter for immunization: Secondary | ICD-10-CM | POA: Diagnosis not present

## 2015-11-10 DIAGNOSIS — L820 Inflamed seborrheic keratosis: Secondary | ICD-10-CM | POA: Diagnosis not present

## 2015-11-10 DIAGNOSIS — D485 Neoplasm of uncertain behavior of skin: Secondary | ICD-10-CM | POA: Diagnosis not present

## 2015-11-11 DIAGNOSIS — B079 Viral wart, unspecified: Secondary | ICD-10-CM | POA: Diagnosis not present

## 2015-11-18 DIAGNOSIS — R05 Cough: Secondary | ICD-10-CM | POA: Diagnosis not present

## 2015-11-18 DIAGNOSIS — R6889 Other general symptoms and signs: Secondary | ICD-10-CM | POA: Diagnosis not present

## 2015-11-18 DIAGNOSIS — J205 Acute bronchitis due to respiratory syncytial virus: Secondary | ICD-10-CM | POA: Diagnosis not present

## 2015-11-18 DIAGNOSIS — J209 Acute bronchitis, unspecified: Secondary | ICD-10-CM | POA: Diagnosis not present

## 2015-12-14 DIAGNOSIS — J309 Allergic rhinitis, unspecified: Secondary | ICD-10-CM | POA: Diagnosis not present

## 2015-12-14 DIAGNOSIS — F331 Major depressive disorder, recurrent, moderate: Secondary | ICD-10-CM | POA: Diagnosis not present

## 2015-12-14 DIAGNOSIS — M5412 Radiculopathy, cervical region: Secondary | ICD-10-CM | POA: Diagnosis not present

## 2015-12-14 DIAGNOSIS — J069 Acute upper respiratory infection, unspecified: Secondary | ICD-10-CM | POA: Diagnosis not present

## 2015-12-14 DIAGNOSIS — M7541 Impingement syndrome of right shoulder: Secondary | ICD-10-CM | POA: Diagnosis not present

## 2015-12-28 DIAGNOSIS — Z6833 Body mass index (BMI) 33.0-33.9, adult: Secondary | ICD-10-CM | POA: Diagnosis not present

## 2015-12-28 DIAGNOSIS — F329 Major depressive disorder, single episode, unspecified: Secondary | ICD-10-CM | POA: Diagnosis not present

## 2015-12-28 DIAGNOSIS — M9981 Other biomechanical lesions of cervical region: Secondary | ICD-10-CM | POA: Diagnosis not present

## 2015-12-28 DIAGNOSIS — M5412 Radiculopathy, cervical region: Secondary | ICD-10-CM | POA: Diagnosis not present

## 2016-01-04 DIAGNOSIS — M5412 Radiculopathy, cervical region: Secondary | ICD-10-CM | POA: Diagnosis not present

## 2016-01-04 DIAGNOSIS — Z6833 Body mass index (BMI) 33.0-33.9, adult: Secondary | ICD-10-CM | POA: Diagnosis not present

## 2016-02-03 DIAGNOSIS — M9903 Segmental and somatic dysfunction of lumbar region: Secondary | ICD-10-CM | POA: Diagnosis not present

## 2016-02-03 DIAGNOSIS — M5030 Other cervical disc degeneration, unspecified cervical region: Secondary | ICD-10-CM | POA: Diagnosis not present

## 2016-02-03 DIAGNOSIS — M9901 Segmental and somatic dysfunction of cervical region: Secondary | ICD-10-CM | POA: Diagnosis not present

## 2016-02-03 DIAGNOSIS — M9902 Segmental and somatic dysfunction of thoracic region: Secondary | ICD-10-CM | POA: Diagnosis not present

## 2016-02-03 DIAGNOSIS — M9904 Segmental and somatic dysfunction of sacral region: Secondary | ICD-10-CM | POA: Diagnosis not present

## 2016-02-03 DIAGNOSIS — M5136 Other intervertebral disc degeneration, lumbar region: Secondary | ICD-10-CM | POA: Diagnosis not present

## 2016-02-05 DIAGNOSIS — M9904 Segmental and somatic dysfunction of sacral region: Secondary | ICD-10-CM | POA: Diagnosis not present

## 2016-02-05 DIAGNOSIS — M9902 Segmental and somatic dysfunction of thoracic region: Secondary | ICD-10-CM | POA: Diagnosis not present

## 2016-02-05 DIAGNOSIS — M5030 Other cervical disc degeneration, unspecified cervical region: Secondary | ICD-10-CM | POA: Diagnosis not present

## 2016-02-05 DIAGNOSIS — M9901 Segmental and somatic dysfunction of cervical region: Secondary | ICD-10-CM | POA: Diagnosis not present

## 2016-02-05 DIAGNOSIS — M5136 Other intervertebral disc degeneration, lumbar region: Secondary | ICD-10-CM | POA: Diagnosis not present

## 2016-02-05 DIAGNOSIS — M9903 Segmental and somatic dysfunction of lumbar region: Secondary | ICD-10-CM | POA: Diagnosis not present

## 2016-02-10 DIAGNOSIS — M9901 Segmental and somatic dysfunction of cervical region: Secondary | ICD-10-CM | POA: Diagnosis not present

## 2016-02-10 DIAGNOSIS — M9903 Segmental and somatic dysfunction of lumbar region: Secondary | ICD-10-CM | POA: Diagnosis not present

## 2016-02-10 DIAGNOSIS — M9902 Segmental and somatic dysfunction of thoracic region: Secondary | ICD-10-CM | POA: Diagnosis not present

## 2016-02-10 DIAGNOSIS — M5136 Other intervertebral disc degeneration, lumbar region: Secondary | ICD-10-CM | POA: Diagnosis not present

## 2016-02-10 DIAGNOSIS — M9904 Segmental and somatic dysfunction of sacral region: Secondary | ICD-10-CM | POA: Diagnosis not present

## 2016-02-10 DIAGNOSIS — M5030 Other cervical disc degeneration, unspecified cervical region: Secondary | ICD-10-CM | POA: Diagnosis not present

## 2016-02-16 DIAGNOSIS — M546 Pain in thoracic spine: Secondary | ICD-10-CM | POA: Diagnosis not present

## 2016-02-16 DIAGNOSIS — M5442 Lumbago with sciatica, left side: Secondary | ICD-10-CM | POA: Diagnosis not present

## 2016-02-16 DIAGNOSIS — M9904 Segmental and somatic dysfunction of sacral region: Secondary | ICD-10-CM | POA: Diagnosis not present

## 2016-02-16 DIAGNOSIS — M62838 Other muscle spasm: Secondary | ICD-10-CM | POA: Diagnosis not present

## 2016-02-18 DIAGNOSIS — M62838 Other muscle spasm: Secondary | ICD-10-CM | POA: Diagnosis not present

## 2016-02-18 DIAGNOSIS — M546 Pain in thoracic spine: Secondary | ICD-10-CM | POA: Diagnosis not present

## 2016-02-18 DIAGNOSIS — M9904 Segmental and somatic dysfunction of sacral region: Secondary | ICD-10-CM | POA: Diagnosis not present

## 2016-02-18 DIAGNOSIS — M5442 Lumbago with sciatica, left side: Secondary | ICD-10-CM | POA: Diagnosis not present

## 2016-02-23 DIAGNOSIS — M546 Pain in thoracic spine: Secondary | ICD-10-CM | POA: Diagnosis not present

## 2016-02-23 DIAGNOSIS — M62838 Other muscle spasm: Secondary | ICD-10-CM | POA: Diagnosis not present

## 2016-02-23 DIAGNOSIS — M9904 Segmental and somatic dysfunction of sacral region: Secondary | ICD-10-CM | POA: Diagnosis not present

## 2016-02-23 DIAGNOSIS — M5442 Lumbago with sciatica, left side: Secondary | ICD-10-CM | POA: Diagnosis not present

## 2016-02-24 DIAGNOSIS — M5442 Lumbago with sciatica, left side: Secondary | ICD-10-CM | POA: Diagnosis not present

## 2016-02-24 DIAGNOSIS — M9904 Segmental and somatic dysfunction of sacral region: Secondary | ICD-10-CM | POA: Diagnosis not present

## 2016-02-24 DIAGNOSIS — M62838 Other muscle spasm: Secondary | ICD-10-CM | POA: Diagnosis not present

## 2016-02-24 DIAGNOSIS — M546 Pain in thoracic spine: Secondary | ICD-10-CM | POA: Diagnosis not present

## 2016-02-26 DIAGNOSIS — M62838 Other muscle spasm: Secondary | ICD-10-CM | POA: Diagnosis not present

## 2016-02-26 DIAGNOSIS — M546 Pain in thoracic spine: Secondary | ICD-10-CM | POA: Diagnosis not present

## 2016-02-26 DIAGNOSIS — M9904 Segmental and somatic dysfunction of sacral region: Secondary | ICD-10-CM | POA: Diagnosis not present

## 2016-02-26 DIAGNOSIS — M5442 Lumbago with sciatica, left side: Secondary | ICD-10-CM | POA: Diagnosis not present

## 2016-02-29 DIAGNOSIS — M9904 Segmental and somatic dysfunction of sacral region: Secondary | ICD-10-CM | POA: Diagnosis not present

## 2016-02-29 DIAGNOSIS — M62838 Other muscle spasm: Secondary | ICD-10-CM | POA: Diagnosis not present

## 2016-02-29 DIAGNOSIS — M5442 Lumbago with sciatica, left side: Secondary | ICD-10-CM | POA: Diagnosis not present

## 2016-02-29 DIAGNOSIS — M546 Pain in thoracic spine: Secondary | ICD-10-CM | POA: Diagnosis not present

## 2016-03-22 DIAGNOSIS — M5416 Radiculopathy, lumbar region: Secondary | ICD-10-CM | POA: Diagnosis not present

## 2016-03-23 ENCOUNTER — Other Ambulatory Visit: Payer: Self-pay | Admitting: Family Medicine

## 2016-03-23 DIAGNOSIS — Z0000 Encounter for general adult medical examination without abnormal findings: Secondary | ICD-10-CM | POA: Diagnosis not present

## 2016-03-23 DIAGNOSIS — M8580 Other specified disorders of bone density and structure, unspecified site: Secondary | ICD-10-CM

## 2016-03-23 DIAGNOSIS — Z1231 Encounter for screening mammogram for malignant neoplasm of breast: Secondary | ICD-10-CM

## 2016-03-23 DIAGNOSIS — Z136 Encounter for screening for cardiovascular disorders: Secondary | ICD-10-CM | POA: Diagnosis not present

## 2016-03-23 DIAGNOSIS — Z139 Encounter for screening, unspecified: Secondary | ICD-10-CM | POA: Diagnosis not present

## 2016-03-23 DIAGNOSIS — Z23 Encounter for immunization: Secondary | ICD-10-CM | POA: Diagnosis not present

## 2016-03-23 DIAGNOSIS — Z7289 Other problems related to lifestyle: Secondary | ICD-10-CM | POA: Diagnosis not present

## 2016-03-24 DIAGNOSIS — M5416 Radiculopathy, lumbar region: Secondary | ICD-10-CM | POA: Diagnosis not present

## 2016-03-24 DIAGNOSIS — M5126 Other intervertebral disc displacement, lumbar region: Secondary | ICD-10-CM | POA: Diagnosis not present

## 2016-04-01 ENCOUNTER — Ambulatory Visit
Admission: RE | Admit: 2016-04-01 | Discharge: 2016-04-01 | Disposition: A | Discharge status: Home / Self Care | Payer: Medicare Other | Source: Ambulatory Visit | Attending: Family Medicine | Admitting: Family Medicine

## 2016-04-01 DIAGNOSIS — M8580 Other specified disorders of bone density and structure, unspecified site: Secondary | ICD-10-CM

## 2016-04-01 DIAGNOSIS — Z1231 Encounter for screening mammogram for malignant neoplasm of breast: Secondary | ICD-10-CM

## 2016-04-01 DIAGNOSIS — Z780 Asymptomatic menopausal state: Secondary | ICD-10-CM | POA: Diagnosis not present

## 2016-04-01 DIAGNOSIS — M85852 Other specified disorders of bone density and structure, left thigh: Secondary | ICD-10-CM | POA: Diagnosis not present

## 2016-04-07 DIAGNOSIS — M5416 Radiculopathy, lumbar region: Secondary | ICD-10-CM | POA: Diagnosis not present

## 2016-04-13 DIAGNOSIS — M8580 Other specified disorders of bone density and structure, unspecified site: Secondary | ICD-10-CM | POA: Diagnosis not present

## 2016-04-13 DIAGNOSIS — R5383 Other fatigue: Secondary | ICD-10-CM | POA: Diagnosis not present

## 2016-04-13 DIAGNOSIS — Z6834 Body mass index (BMI) 34.0-34.9, adult: Secondary | ICD-10-CM | POA: Diagnosis not present

## 2016-04-13 DIAGNOSIS — E559 Vitamin D deficiency, unspecified: Secondary | ICD-10-CM | POA: Diagnosis not present

## 2016-04-13 DIAGNOSIS — M5416 Radiculopathy, lumbar region: Secondary | ICD-10-CM | POA: Diagnosis not present

## 2016-04-15 DIAGNOSIS — M8580 Other specified disorders of bone density and structure, unspecified site: Secondary | ICD-10-CM | POA: Diagnosis not present

## 2016-04-15 DIAGNOSIS — F331 Major depressive disorder, recurrent, moderate: Secondary | ICD-10-CM | POA: Diagnosis not present

## 2016-04-15 DIAGNOSIS — R5383 Other fatigue: Secondary | ICD-10-CM | POA: Diagnosis not present

## 2016-04-15 DIAGNOSIS — E559 Vitamin D deficiency, unspecified: Secondary | ICD-10-CM | POA: Diagnosis not present

## 2016-04-19 ENCOUNTER — Other Ambulatory Visit: Payer: Self-pay | Admitting: Family Medicine

## 2016-04-19 DIAGNOSIS — N6489 Other specified disorders of breast: Secondary | ICD-10-CM

## 2016-04-19 DIAGNOSIS — R921 Mammographic calcification found on diagnostic imaging of breast: Secondary | ICD-10-CM

## 2016-04-19 DIAGNOSIS — R928 Other abnormal and inconclusive findings on diagnostic imaging of breast: Secondary | ICD-10-CM

## 2016-04-21 ENCOUNTER — Ambulatory Visit
Admission: RE | Admit: 2016-04-21 | Discharge: 2016-04-21 | Disposition: A | Discharge status: Home / Self Care | Payer: Medicare Other | Source: Ambulatory Visit | Attending: Family Medicine | Admitting: Family Medicine

## 2016-04-21 DIAGNOSIS — R921 Mammographic calcification found on diagnostic imaging of breast: Secondary | ICD-10-CM | POA: Diagnosis not present

## 2016-04-21 DIAGNOSIS — R928 Other abnormal and inconclusive findings on diagnostic imaging of breast: Secondary | ICD-10-CM

## 2016-05-30 DIAGNOSIS — F331 Major depressive disorder, recurrent, moderate: Secondary | ICD-10-CM | POA: Diagnosis not present

## 2016-05-30 DIAGNOSIS — Z23 Encounter for immunization: Secondary | ICD-10-CM | POA: Diagnosis not present

## 2016-08-24 DIAGNOSIS — M5416 Radiculopathy, lumbar region: Secondary | ICD-10-CM | POA: Diagnosis not present

## 2016-09-09 DIAGNOSIS — L853 Xerosis cutis: Secondary | ICD-10-CM | POA: Diagnosis not present

## 2016-09-09 DIAGNOSIS — L858 Other specified epidermal thickening: Secondary | ICD-10-CM | POA: Diagnosis not present

## 2016-09-09 DIAGNOSIS — M7751 Other enthesopathy of right foot: Secondary | ICD-10-CM | POA: Diagnosis not present

## 2016-09-09 DIAGNOSIS — M7752 Other enthesopathy of left foot: Secondary | ICD-10-CM | POA: Diagnosis not present

## 2016-09-09 DIAGNOSIS — M779 Enthesopathy, unspecified: Secondary | ICD-10-CM | POA: Diagnosis not present

## 2016-09-09 DIAGNOSIS — L600 Ingrowing nail: Secondary | ICD-10-CM | POA: Diagnosis not present

## 2016-09-22 DIAGNOSIS — E559 Vitamin D deficiency, unspecified: Secondary | ICD-10-CM | POA: Diagnosis not present

## 2016-09-26 DIAGNOSIS — Z6834 Body mass index (BMI) 34.0-34.9, adult: Secondary | ICD-10-CM | POA: Diagnosis not present

## 2016-09-26 DIAGNOSIS — R21 Rash and other nonspecific skin eruption: Secondary | ICD-10-CM | POA: Diagnosis not present

## 2016-09-26 DIAGNOSIS — K219 Gastro-esophageal reflux disease without esophagitis: Secondary | ICD-10-CM | POA: Diagnosis not present

## 2016-09-26 DIAGNOSIS — Z23 Encounter for immunization: Secondary | ICD-10-CM | POA: Diagnosis not present

## 2016-09-26 DIAGNOSIS — E559 Vitamin D deficiency, unspecified: Secondary | ICD-10-CM | POA: Diagnosis not present

## 2016-09-26 DIAGNOSIS — J309 Allergic rhinitis, unspecified: Secondary | ICD-10-CM | POA: Diagnosis not present

## 2016-11-02 DIAGNOSIS — J3081 Allergic rhinitis due to animal (cat) (dog) hair and dander: Secondary | ICD-10-CM | POA: Diagnosis not present

## 2016-11-02 DIAGNOSIS — Z6834 Body mass index (BMI) 34.0-34.9, adult: Secondary | ICD-10-CM | POA: Diagnosis not present

## 2016-11-02 DIAGNOSIS — K219 Gastro-esophageal reflux disease without esophagitis: Secondary | ICD-10-CM | POA: Diagnosis not present

## 2016-11-02 DIAGNOSIS — L309 Dermatitis, unspecified: Secondary | ICD-10-CM | POA: Diagnosis not present

## 2016-11-29 ENCOUNTER — Other Ambulatory Visit: Payer: Self-pay | Admitting: Family Medicine

## 2016-11-29 DIAGNOSIS — R921 Mammographic calcification found on diagnostic imaging of breast: Secondary | ICD-10-CM

## 2016-12-06 ENCOUNTER — Ambulatory Visit
Admission: RE | Admit: 2016-12-06 | Discharge: 2016-12-06 | Disposition: A | Discharge status: Home / Self Care | Payer: Medicare Other | Source: Ambulatory Visit | Attending: Family Medicine | Admitting: Family Medicine

## 2016-12-06 DIAGNOSIS — R921 Mammographic calcification found on diagnostic imaging of breast: Secondary | ICD-10-CM

## 2016-12-06 DIAGNOSIS — R928 Other abnormal and inconclusive findings on diagnostic imaging of breast: Secondary | ICD-10-CM | POA: Diagnosis not present

## 2016-12-28 DIAGNOSIS — M5416 Radiculopathy, lumbar region: Secondary | ICD-10-CM | POA: Diagnosis not present

## 2017-01-11 DIAGNOSIS — M5416 Radiculopathy, lumbar region: Secondary | ICD-10-CM | POA: Diagnosis not present

## 2017-01-11 DIAGNOSIS — R030 Elevated blood-pressure reading, without diagnosis of hypertension: Secondary | ICD-10-CM | POA: Diagnosis not present

## 2017-01-11 DIAGNOSIS — Z6834 Body mass index (BMI) 34.0-34.9, adult: Secondary | ICD-10-CM | POA: Diagnosis not present

## 2017-02-13 DIAGNOSIS — J069 Acute upper respiratory infection, unspecified: Secondary | ICD-10-CM | POA: Diagnosis not present

## 2017-02-21 DIAGNOSIS — Z6835 Body mass index (BMI) 35.0-35.9, adult: Secondary | ICD-10-CM | POA: Diagnosis not present

## 2017-02-21 DIAGNOSIS — J3081 Allergic rhinitis due to animal (cat) (dog) hair and dander: Secondary | ICD-10-CM | POA: Diagnosis not present

## 2017-02-21 DIAGNOSIS — F331 Major depressive disorder, recurrent, moderate: Secondary | ICD-10-CM | POA: Diagnosis not present

## 2017-02-21 DIAGNOSIS — K219 Gastro-esophageal reflux disease without esophagitis: Secondary | ICD-10-CM | POA: Diagnosis not present

## 2017-04-04 DIAGNOSIS — F331 Major depressive disorder, recurrent, moderate: Secondary | ICD-10-CM | POA: Diagnosis not present

## 2017-04-04 DIAGNOSIS — F411 Generalized anxiety disorder: Secondary | ICD-10-CM | POA: Diagnosis not present

## 2017-04-04 DIAGNOSIS — R682 Dry mouth, unspecified: Secondary | ICD-10-CM | POA: Diagnosis not present

## 2017-04-04 DIAGNOSIS — J309 Allergic rhinitis, unspecified: Secondary | ICD-10-CM | POA: Diagnosis not present

## 2017-04-18 ENCOUNTER — Ambulatory Visit: Payer: Medicare Other | Admitting: Licensed Clinical Social Worker

## 2017-04-21 ENCOUNTER — Ambulatory Visit (INDEPENDENT_AMBULATORY_CARE_PROVIDER_SITE_OTHER): Payer: Medicare Other | Admitting: Licensed Clinical Social Worker

## 2017-04-21 DIAGNOSIS — F331 Major depressive disorder, recurrent, moderate: Secondary | ICD-10-CM

## 2017-04-25 DIAGNOSIS — M5416 Radiculopathy, lumbar region: Secondary | ICD-10-CM | POA: Diagnosis not present

## 2017-04-27 ENCOUNTER — Ambulatory Visit (INDEPENDENT_AMBULATORY_CARE_PROVIDER_SITE_OTHER): Payer: Medicare Other | Admitting: Licensed Clinical Social Worker

## 2017-04-27 DIAGNOSIS — F331 Major depressive disorder, recurrent, moderate: Secondary | ICD-10-CM | POA: Diagnosis not present

## 2017-05-10 ENCOUNTER — Ambulatory Visit (INDEPENDENT_AMBULATORY_CARE_PROVIDER_SITE_OTHER): Payer: Medicare Other | Admitting: Licensed Clinical Social Worker

## 2017-05-10 DIAGNOSIS — F3341 Major depressive disorder, recurrent, in partial remission: Secondary | ICD-10-CM | POA: Diagnosis not present

## 2017-06-06 DIAGNOSIS — F331 Major depressive disorder, recurrent, moderate: Secondary | ICD-10-CM | POA: Diagnosis not present

## 2017-06-06 DIAGNOSIS — J309 Allergic rhinitis, unspecified: Secondary | ICD-10-CM | POA: Diagnosis not present

## 2017-06-06 DIAGNOSIS — Z6834 Body mass index (BMI) 34.0-34.9, adult: Secondary | ICD-10-CM | POA: Diagnosis not present

## 2017-06-06 DIAGNOSIS — Z23 Encounter for immunization: Secondary | ICD-10-CM | POA: Diagnosis not present

## 2017-06-06 DIAGNOSIS — K219 Gastro-esophageal reflux disease without esophagitis: Secondary | ICD-10-CM | POA: Diagnosis not present

## 2017-06-13 DIAGNOSIS — Z6834 Body mass index (BMI) 34.0-34.9, adult: Secondary | ICD-10-CM | POA: Diagnosis not present

## 2017-06-13 DIAGNOSIS — R030 Elevated blood-pressure reading, without diagnosis of hypertension: Secondary | ICD-10-CM | POA: Diagnosis not present

## 2017-06-13 DIAGNOSIS — M5416 Radiculopathy, lumbar region: Secondary | ICD-10-CM | POA: Diagnosis not present

## 2017-06-14 ENCOUNTER — Ambulatory Visit (INDEPENDENT_AMBULATORY_CARE_PROVIDER_SITE_OTHER): Payer: Medicare Other | Admitting: Licensed Clinical Social Worker

## 2017-06-14 DIAGNOSIS — F3341 Major depressive disorder, recurrent, in partial remission: Secondary | ICD-10-CM

## 2017-06-16 ENCOUNTER — Other Ambulatory Visit: Payer: Self-pay | Admitting: Family Medicine

## 2017-06-16 DIAGNOSIS — R921 Mammographic calcification found on diagnostic imaging of breast: Secondary | ICD-10-CM

## 2017-06-20 ENCOUNTER — Ambulatory Visit (INDEPENDENT_AMBULATORY_CARE_PROVIDER_SITE_OTHER): Payer: Medicare Other | Admitting: Licensed Clinical Social Worker

## 2017-06-20 DIAGNOSIS — F3341 Major depressive disorder, recurrent, in partial remission: Secondary | ICD-10-CM

## 2017-06-21 DIAGNOSIS — F332 Major depressive disorder, recurrent severe without psychotic features: Secondary | ICD-10-CM | POA: Diagnosis not present

## 2017-06-22 ENCOUNTER — Other Ambulatory Visit: Payer: Self-pay | Admitting: Family Medicine

## 2017-06-22 ENCOUNTER — Ambulatory Visit
Admission: RE | Admit: 2017-06-22 | Discharge: 2017-06-22 | Disposition: A | Discharge status: Home / Self Care | Payer: Medicare Other | Source: Ambulatory Visit | Attending: Family Medicine | Admitting: Family Medicine

## 2017-06-22 DIAGNOSIS — N6310 Unspecified lump in the right breast, unspecified quadrant: Secondary | ICD-10-CM

## 2017-06-22 DIAGNOSIS — R921 Mammographic calcification found on diagnostic imaging of breast: Secondary | ICD-10-CM

## 2017-06-22 DIAGNOSIS — R928 Other abnormal and inconclusive findings on diagnostic imaging of breast: Secondary | ICD-10-CM | POA: Diagnosis not present

## 2017-06-22 DIAGNOSIS — N6489 Other specified disorders of breast: Secondary | ICD-10-CM | POA: Diagnosis not present

## 2017-06-26 ENCOUNTER — Ambulatory Visit
Admission: RE | Admit: 2017-06-26 | Discharge: 2017-06-26 | Disposition: A | Discharge status: Home / Self Care | Payer: Medicare Other | Source: Ambulatory Visit | Attending: Family Medicine | Admitting: Family Medicine

## 2017-06-26 ENCOUNTER — Other Ambulatory Visit: Payer: Self-pay | Admitting: Family Medicine

## 2017-06-26 DIAGNOSIS — R928 Other abnormal and inconclusive findings on diagnostic imaging of breast: Secondary | ICD-10-CM

## 2017-06-26 DIAGNOSIS — N6313 Unspecified lump in the right breast, lower outer quadrant: Secondary | ICD-10-CM | POA: Diagnosis not present

## 2017-06-26 DIAGNOSIS — N6310 Unspecified lump in the right breast, unspecified quadrant: Secondary | ICD-10-CM

## 2017-06-26 DIAGNOSIS — N6011 Diffuse cystic mastopathy of right breast: Secondary | ICD-10-CM | POA: Diagnosis not present

## 2017-06-28 DIAGNOSIS — Z6835 Body mass index (BMI) 35.0-35.9, adult: Secondary | ICD-10-CM | POA: Diagnosis not present

## 2017-06-28 DIAGNOSIS — M5416 Radiculopathy, lumbar region: Secondary | ICD-10-CM | POA: Diagnosis not present

## 2017-06-28 DIAGNOSIS — Z6834 Body mass index (BMI) 34.0-34.9, adult: Secondary | ICD-10-CM | POA: Diagnosis not present

## 2017-06-28 DIAGNOSIS — C4492 Squamous cell carcinoma of skin, unspecified: Secondary | ICD-10-CM | POA: Diagnosis not present

## 2017-06-28 DIAGNOSIS — M4802 Spinal stenosis, cervical region: Secondary | ICD-10-CM | POA: Diagnosis not present

## 2017-06-28 DIAGNOSIS — R030 Elevated blood-pressure reading, without diagnosis of hypertension: Secondary | ICD-10-CM | POA: Diagnosis not present

## 2017-06-28 DIAGNOSIS — F331 Major depressive disorder, recurrent, moderate: Secondary | ICD-10-CM | POA: Diagnosis not present

## 2017-07-05 ENCOUNTER — Ambulatory Visit (INDEPENDENT_AMBULATORY_CARE_PROVIDER_SITE_OTHER): Payer: Medicare Other | Admitting: Licensed Clinical Social Worker

## 2017-07-05 DIAGNOSIS — F3341 Major depressive disorder, recurrent, in partial remission: Secondary | ICD-10-CM | POA: Diagnosis not present

## 2017-07-13 DIAGNOSIS — R030 Elevated blood-pressure reading, without diagnosis of hypertension: Secondary | ICD-10-CM | POA: Diagnosis not present

## 2017-07-13 DIAGNOSIS — F329 Major depressive disorder, single episode, unspecified: Secondary | ICD-10-CM | POA: Diagnosis not present

## 2017-07-13 DIAGNOSIS — M5416 Radiculopathy, lumbar region: Secondary | ICD-10-CM | POA: Diagnosis not present

## 2017-07-13 DIAGNOSIS — M5412 Radiculopathy, cervical region: Secondary | ICD-10-CM | POA: Diagnosis not present

## 2017-07-13 DIAGNOSIS — M9981 Other biomechanical lesions of cervical region: Secondary | ICD-10-CM | POA: Diagnosis not present

## 2017-07-13 DIAGNOSIS — Z6835 Body mass index (BMI) 35.0-35.9, adult: Secondary | ICD-10-CM | POA: Diagnosis not present

## 2017-07-26 DIAGNOSIS — R030 Elevated blood-pressure reading, without diagnosis of hypertension: Secondary | ICD-10-CM | POA: Diagnosis not present

## 2017-07-26 DIAGNOSIS — M461 Sacroiliitis, not elsewhere classified: Secondary | ICD-10-CM | POA: Diagnosis not present

## 2017-07-26 DIAGNOSIS — Z6835 Body mass index (BMI) 35.0-35.9, adult: Secondary | ICD-10-CM | POA: Diagnosis not present

## 2017-08-01 ENCOUNTER — Ambulatory Visit (INDEPENDENT_AMBULATORY_CARE_PROVIDER_SITE_OTHER): Payer: Medicare Other | Admitting: Licensed Clinical Social Worker

## 2017-08-01 DIAGNOSIS — M549 Dorsalgia, unspecified: Secondary | ICD-10-CM | POA: Diagnosis not present

## 2017-08-01 DIAGNOSIS — J309 Allergic rhinitis, unspecified: Secondary | ICD-10-CM | POA: Diagnosis not present

## 2017-08-01 DIAGNOSIS — F331 Major depressive disorder, recurrent, moderate: Secondary | ICD-10-CM | POA: Diagnosis not present

## 2017-08-01 DIAGNOSIS — F3341 Major depressive disorder, recurrent, in partial remission: Secondary | ICD-10-CM

## 2017-08-01 DIAGNOSIS — Z6834 Body mass index (BMI) 34.0-34.9, adult: Secondary | ICD-10-CM | POA: Diagnosis not present

## 2017-08-10 DIAGNOSIS — Z6834 Body mass index (BMI) 34.0-34.9, adult: Secondary | ICD-10-CM | POA: Diagnosis not present

## 2017-08-10 DIAGNOSIS — R030 Elevated blood-pressure reading, without diagnosis of hypertension: Secondary | ICD-10-CM | POA: Diagnosis not present

## 2017-08-10 DIAGNOSIS — M5416 Radiculopathy, lumbar region: Secondary | ICD-10-CM | POA: Diagnosis not present

## 2017-08-14 ENCOUNTER — Other Ambulatory Visit: Payer: Self-pay | Admitting: Physical Medicine and Rehabilitation

## 2017-08-14 DIAGNOSIS — M5416 Radiculopathy, lumbar region: Secondary | ICD-10-CM

## 2017-08-16 DIAGNOSIS — Z1211 Encounter for screening for malignant neoplasm of colon: Secondary | ICD-10-CM | POA: Diagnosis not present

## 2017-08-16 DIAGNOSIS — H6121 Impacted cerumen, right ear: Secondary | ICD-10-CM | POA: Diagnosis not present

## 2017-08-16 DIAGNOSIS — M549 Dorsalgia, unspecified: Secondary | ICD-10-CM | POA: Diagnosis not present

## 2017-08-16 DIAGNOSIS — F331 Major depressive disorder, recurrent, moderate: Secondary | ICD-10-CM | POA: Diagnosis not present

## 2017-08-16 DIAGNOSIS — M4802 Spinal stenosis, cervical region: Secondary | ICD-10-CM | POA: Diagnosis not present

## 2017-08-16 DIAGNOSIS — Z6834 Body mass index (BMI) 34.0-34.9, adult: Secondary | ICD-10-CM | POA: Diagnosis not present

## 2017-08-16 DIAGNOSIS — Z0000 Encounter for general adult medical examination without abnormal findings: Secondary | ICD-10-CM | POA: Diagnosis not present

## 2017-08-22 ENCOUNTER — Ambulatory Visit (INDEPENDENT_AMBULATORY_CARE_PROVIDER_SITE_OTHER): Payer: Medicare Other | Admitting: Licensed Clinical Social Worker

## 2017-08-22 DIAGNOSIS — F3341 Major depressive disorder, recurrent, in partial remission: Secondary | ICD-10-CM

## 2017-08-23 ENCOUNTER — Ambulatory Visit
Admission: RE | Admit: 2017-08-23 | Discharge: 2017-08-23 | Disposition: A | Discharge status: Home / Self Care | Payer: Medicare Other | Source: Ambulatory Visit | Attending: Physical Medicine and Rehabilitation | Admitting: Physical Medicine and Rehabilitation

## 2017-08-23 DIAGNOSIS — M5126 Other intervertebral disc displacement, lumbar region: Secondary | ICD-10-CM | POA: Diagnosis not present

## 2017-08-23 DIAGNOSIS — M5416 Radiculopathy, lumbar region: Secondary | ICD-10-CM

## 2017-08-23 MED ORDER — ONDANSETRON HCL 4 MG/2ML IJ SOLN: 4.0000 mg | Freq: Four times a day (QID) | INTRAMUSCULAR | Status: DC | PRN

## 2017-08-23 MED ORDER — ONDANSETRON HCL 4 MG/2ML IJ SOLN
4.0000 mg | Freq: Once | INTRAMUSCULAR | Status: AC
  Administered 2017-08-23: 4 mg via INTRAMUSCULAR

## 2017-08-23 MED ORDER — DIAZEPAM 5 MG PO TABS
5.0000 mg | ORAL_TABLET | Freq: Once | ORAL | Status: AC
  Administered 2017-08-23: 5 mg via ORAL

## 2017-08-23 MED ORDER — IOPAMIDOL (ISOVUE-M 200) INJECTION 41%
15.0000 mL | Freq: Once | INTRAMUSCULAR | Status: AC
  Administered 2017-08-23: 15 mL via INTRATHECAL

## 2017-08-23 MED ORDER — MEPERIDINE HCL 100 MG/ML IJ SOLN
75.0000 mg | Freq: Once | INTRAMUSCULAR | Status: AC
  Administered 2017-08-23: 75 mg via INTRAMUSCULAR

## 2017-08-23 NOTE — Progress Notes (Signed)
Pt states she has been off her Lexapro for the past 2 days.

## 2017-08-23 NOTE — Discharge Instructions (Signed)
Myelogram Discharge Instructions  1. Go home and rest quietly for the next 24 hours.  It is important to lie flat for the next 24 hours.  Get up only to go to the restroom.  You may lie in the bed or on a couch on your back, your stomach, your left side or your right side.  You may have one pillow under your head.  You may have pillows between your knees while you are on your side or under your knees while you are on your back.  2. DO NOT drive today.  Recline the seat as far back as it will go, while still wearing your seat belt, on the way home.  3. You may get up to go to the bathroom as needed.  You may sit up for 10 minutes to eat.  You may resume your normal diet and medications unless otherwise indicated.  Drink lots of extra fluids today and tomorrow.  4. The incidence of headache, nausea, or vomiting is about 5% (one in 20 patients).  If you develop a headache, lie flat and drink plenty of fluids until the headache goes away.  Caffeinated beverages may be helpful.  If you develop severe nausea and vomiting or a headache that does not go away with flat bed rest, call [phone].  5. You may resume normal activities after your 24 hours of bed rest is over; however, do not exert yourself strongly or do any heavy lifting tomorrow. If when you get up you have a headache when standing, go back to bed and force fluids for another 24 hours.  6. Call your physician for a follow-up appointment.  The results of your myelogram will be sent directly to your physician by the following day.  7. If you have any questions or if complications develop after you arrive home, please call [phone].  Discharge instructions have been explained to the patient.  The patient, or the person responsible for the patient, fully understands these instructions.       May resume Lexapro on Dec. 13, 2018, after 10:30 am.

## 2017-08-24 ENCOUNTER — Ambulatory Visit: Payer: Medicare Other | Admitting: Licensed Clinical Social Worker

## 2017-09-21 DIAGNOSIS — Z6835 Body mass index (BMI) 35.0-35.9, adult: Secondary | ICD-10-CM | POA: Diagnosis not present

## 2017-09-21 DIAGNOSIS — M5416 Radiculopathy, lumbar region: Secondary | ICD-10-CM | POA: Diagnosis not present

## 2017-09-21 DIAGNOSIS — R030 Elevated blood-pressure reading, without diagnosis of hypertension: Secondary | ICD-10-CM | POA: Diagnosis not present

## 2017-09-21 DIAGNOSIS — F332 Major depressive disorder, recurrent severe without psychotic features: Secondary | ICD-10-CM | POA: Diagnosis not present

## 2017-09-21 DIAGNOSIS — M48062 Spinal stenosis, lumbar region with neurogenic claudication: Secondary | ICD-10-CM | POA: Diagnosis not present

## 2017-09-21 DIAGNOSIS — M461 Sacroiliitis, not elsewhere classified: Secondary | ICD-10-CM | POA: Diagnosis not present

## 2017-09-22 DIAGNOSIS — L821 Other seborrheic keratosis: Secondary | ICD-10-CM | POA: Diagnosis not present

## 2017-09-22 DIAGNOSIS — F332 Major depressive disorder, recurrent severe without psychotic features: Secondary | ICD-10-CM | POA: Diagnosis not present

## 2017-09-22 DIAGNOSIS — L814 Other melanin hyperpigmentation: Secondary | ICD-10-CM | POA: Diagnosis not present

## 2017-09-22 DIAGNOSIS — L570 Actinic keratosis: Secondary | ICD-10-CM | POA: Diagnosis not present

## 2017-09-26 DIAGNOSIS — F332 Major depressive disorder, recurrent severe without psychotic features: Secondary | ICD-10-CM | POA: Diagnosis not present

## 2017-09-28 DIAGNOSIS — F332 Major depressive disorder, recurrent severe without psychotic features: Secondary | ICD-10-CM | POA: Diagnosis not present

## 2017-10-03 DIAGNOSIS — F332 Major depressive disorder, recurrent severe without psychotic features: Secondary | ICD-10-CM | POA: Diagnosis not present

## 2017-10-04 DIAGNOSIS — R030 Elevated blood-pressure reading, without diagnosis of hypertension: Secondary | ICD-10-CM | POA: Diagnosis not present

## 2017-10-04 DIAGNOSIS — M5416 Radiculopathy, lumbar region: Secondary | ICD-10-CM | POA: Diagnosis not present

## 2017-10-04 DIAGNOSIS — Z6835 Body mass index (BMI) 35.0-35.9, adult: Secondary | ICD-10-CM | POA: Diagnosis not present

## 2017-10-06 DIAGNOSIS — F332 Major depressive disorder, recurrent severe without psychotic features: Secondary | ICD-10-CM | POA: Diagnosis not present

## 2017-10-17 ENCOUNTER — Encounter: Payer: Self-pay | Admitting: Family Medicine

## 2017-11-02 DIAGNOSIS — L821 Other seborrheic keratosis: Secondary | ICD-10-CM | POA: Diagnosis not present

## 2017-11-02 DIAGNOSIS — L570 Actinic keratosis: Secondary | ICD-10-CM | POA: Diagnosis not present

## 2017-11-02 DIAGNOSIS — D1801 Hemangioma of skin and subcutaneous tissue: Secondary | ICD-10-CM | POA: Diagnosis not present

## 2017-11-02 DIAGNOSIS — L853 Xerosis cutis: Secondary | ICD-10-CM | POA: Diagnosis not present

## 2017-11-02 DIAGNOSIS — D692 Other nonthrombocytopenic purpura: Secondary | ICD-10-CM | POA: Diagnosis not present

## 2017-11-08 DIAGNOSIS — F332 Major depressive disorder, recurrent severe without psychotic features: Secondary | ICD-10-CM | POA: Diagnosis not present

## 2017-11-27 DIAGNOSIS — J069 Acute upper respiratory infection, unspecified: Secondary | ICD-10-CM | POA: Diagnosis not present

## 2017-11-27 DIAGNOSIS — Z6835 Body mass index (BMI) 35.0-35.9, adult: Secondary | ICD-10-CM | POA: Diagnosis not present

## 2017-11-27 DIAGNOSIS — F331 Major depressive disorder, recurrent, moderate: Secondary | ICD-10-CM | POA: Diagnosis not present

## 2017-11-27 DIAGNOSIS — J309 Allergic rhinitis, unspecified: Secondary | ICD-10-CM | POA: Diagnosis not present

## 2017-11-29 DIAGNOSIS — M461 Sacroiliitis, not elsewhere classified: Secondary | ICD-10-CM | POA: Diagnosis not present

## 2017-12-06 DIAGNOSIS — M48062 Spinal stenosis, lumbar region with neurogenic claudication: Secondary | ICD-10-CM | POA: Diagnosis not present

## 2017-12-06 DIAGNOSIS — Z79899 Other long term (current) drug therapy: Secondary | ICD-10-CM | POA: Diagnosis not present

## 2017-12-28 DIAGNOSIS — R05 Cough: Secondary | ICD-10-CM | POA: Diagnosis not present

## 2017-12-28 DIAGNOSIS — J309 Allergic rhinitis, unspecified: Secondary | ICD-10-CM | POA: Diagnosis not present

## 2017-12-28 DIAGNOSIS — Z6835 Body mass index (BMI) 35.0-35.9, adult: Secondary | ICD-10-CM | POA: Diagnosis not present

## 2018-01-02 DIAGNOSIS — M48062 Spinal stenosis, lumbar region with neurogenic claudication: Secondary | ICD-10-CM | POA: Diagnosis not present

## 2018-01-02 DIAGNOSIS — Z79899 Other long term (current) drug therapy: Secondary | ICD-10-CM | POA: Diagnosis not present

## 2018-01-02 DIAGNOSIS — M5416 Radiculopathy, lumbar region: Secondary | ICD-10-CM | POA: Diagnosis not present

## 2018-01-02 DIAGNOSIS — M461 Sacroiliitis, not elsewhere classified: Secondary | ICD-10-CM | POA: Diagnosis not present

## 2018-01-04 DIAGNOSIS — R05 Cough: Secondary | ICD-10-CM | POA: Diagnosis not present

## 2018-01-04 DIAGNOSIS — K219 Gastro-esophageal reflux disease without esophagitis: Secondary | ICD-10-CM | POA: Diagnosis not present

## 2018-01-04 DIAGNOSIS — Z6835 Body mass index (BMI) 35.0-35.9, adult: Secondary | ICD-10-CM | POA: Diagnosis not present

## 2018-02-08 DIAGNOSIS — L570 Actinic keratosis: Secondary | ICD-10-CM | POA: Diagnosis not present

## 2018-02-12 ENCOUNTER — Other Ambulatory Visit: Payer: Self-pay | Admitting: Family Medicine

## 2018-02-12 ENCOUNTER — Ambulatory Visit
Admission: RE | Admit: 2018-02-12 | Discharge: 2018-02-12 | Disposition: A | Discharge status: Home / Self Care | Payer: Medicare Other | Source: Ambulatory Visit | Attending: Family Medicine | Admitting: Family Medicine

## 2018-02-12 DIAGNOSIS — R059 Cough, unspecified: Secondary | ICD-10-CM

## 2018-02-12 DIAGNOSIS — R05 Cough: Secondary | ICD-10-CM

## 2018-02-12 DIAGNOSIS — R7989 Other specified abnormal findings of blood chemistry: Secondary | ICD-10-CM | POA: Diagnosis not present

## 2018-02-12 DIAGNOSIS — R030 Elevated blood-pressure reading, without diagnosis of hypertension: Secondary | ICD-10-CM | POA: Diagnosis not present

## 2018-02-12 DIAGNOSIS — Z79899 Other long term (current) drug therapy: Secondary | ICD-10-CM | POA: Diagnosis not present

## 2018-02-14 DIAGNOSIS — F332 Major depressive disorder, recurrent severe without psychotic features: Secondary | ICD-10-CM | POA: Diagnosis not present

## 2018-02-15 DIAGNOSIS — E782 Mixed hyperlipidemia: Secondary | ICD-10-CM | POA: Diagnosis not present

## 2018-02-15 DIAGNOSIS — J45909 Unspecified asthma, uncomplicated: Secondary | ICD-10-CM | POA: Diagnosis not present

## 2018-02-15 DIAGNOSIS — R05 Cough: Secondary | ICD-10-CM | POA: Diagnosis not present

## 2018-02-15 DIAGNOSIS — F331 Major depressive disorder, recurrent, moderate: Secondary | ICD-10-CM | POA: Diagnosis not present

## 2018-02-15 DIAGNOSIS — K219 Gastro-esophageal reflux disease without esophagitis: Secondary | ICD-10-CM | POA: Diagnosis not present

## 2018-02-15 DIAGNOSIS — J449 Chronic obstructive pulmonary disease, unspecified: Secondary | ICD-10-CM | POA: Diagnosis not present

## 2018-02-15 DIAGNOSIS — M549 Dorsalgia, unspecified: Secondary | ICD-10-CM | POA: Diagnosis not present

## 2018-02-26 DIAGNOSIS — R05 Cough: Secondary | ICD-10-CM | POA: Diagnosis not present

## 2018-02-26 DIAGNOSIS — J449 Chronic obstructive pulmonary disease, unspecified: Secondary | ICD-10-CM | POA: Diagnosis not present

## 2018-02-26 DIAGNOSIS — J45909 Unspecified asthma, uncomplicated: Secondary | ICD-10-CM | POA: Diagnosis not present

## 2018-03-21 DIAGNOSIS — J45909 Unspecified asthma, uncomplicated: Secondary | ICD-10-CM | POA: Diagnosis not present

## 2018-03-21 DIAGNOSIS — Z6835 Body mass index (BMI) 35.0-35.9, adult: Secondary | ICD-10-CM | POA: Diagnosis not present

## 2018-04-13 ENCOUNTER — Other Ambulatory Visit: Payer: Self-pay

## 2018-06-05 DIAGNOSIS — L570 Actinic keratosis: Secondary | ICD-10-CM | POA: Diagnosis not present

## 2018-06-05 DIAGNOSIS — L565 Disseminated superficial actinic porokeratosis (DSAP): Secondary | ICD-10-CM | POA: Diagnosis not present

## 2018-06-05 DIAGNOSIS — L820 Inflamed seborrheic keratosis: Secondary | ICD-10-CM | POA: Diagnosis not present

## 2018-06-07 DIAGNOSIS — Z23 Encounter for immunization: Secondary | ICD-10-CM | POA: Diagnosis not present

## 2018-08-02 ENCOUNTER — Other Ambulatory Visit: Payer: Self-pay

## 2018-08-13 DIAGNOSIS — L97512 Non-pressure chronic ulcer of other part of right foot with fat layer exposed: Secondary | ICD-10-CM | POA: Diagnosis not present

## 2018-08-13 DIAGNOSIS — M2041 Other hammer toe(s) (acquired), right foot: Secondary | ICD-10-CM | POA: Diagnosis not present

## 2018-08-13 DIAGNOSIS — M79674 Pain in right toe(s): Secondary | ICD-10-CM | POA: Diagnosis not present

## 2018-08-20 DIAGNOSIS — N6310 Unspecified lump in the right breast, unspecified quadrant: Secondary | ICD-10-CM | POA: Diagnosis not present

## 2018-08-20 DIAGNOSIS — Z1211 Encounter for screening for malignant neoplasm of colon: Secondary | ICD-10-CM | POA: Diagnosis not present

## 2018-08-20 DIAGNOSIS — J45909 Unspecified asthma, uncomplicated: Secondary | ICD-10-CM | POA: Diagnosis not present

## 2018-08-20 DIAGNOSIS — J449 Chronic obstructive pulmonary disease, unspecified: Secondary | ICD-10-CM | POA: Diagnosis not present

## 2018-08-20 DIAGNOSIS — Z6835 Body mass index (BMI) 35.0-35.9, adult: Secondary | ICD-10-CM | POA: Diagnosis not present

## 2018-08-20 DIAGNOSIS — Z0000 Encounter for general adult medical examination without abnormal findings: Secondary | ICD-10-CM | POA: Diagnosis not present

## 2018-08-20 DIAGNOSIS — F331 Major depressive disorder, recurrent, moderate: Secondary | ICD-10-CM | POA: Diagnosis not present

## 2018-08-20 DIAGNOSIS — Z01419 Encounter for gynecological examination (general) (routine) without abnormal findings: Secondary | ICD-10-CM | POA: Diagnosis not present

## 2018-08-21 ENCOUNTER — Other Ambulatory Visit: Payer: Self-pay | Admitting: Family Medicine

## 2018-08-21 DIAGNOSIS — N6310 Unspecified lump in the right breast, unspecified quadrant: Secondary | ICD-10-CM

## 2018-08-24 ENCOUNTER — Other Ambulatory Visit: Payer: Self-pay

## 2018-08-24 ENCOUNTER — Inpatient Hospital Stay: Admission: RE | Admit: 2018-08-24 | Payer: Self-pay | Source: Ambulatory Visit

## 2018-08-27 DIAGNOSIS — M71571 Other bursitis, not elsewhere classified, right ankle and foot: Secondary | ICD-10-CM | POA: Diagnosis not present

## 2018-08-27 DIAGNOSIS — L97511 Non-pressure chronic ulcer of other part of right foot limited to breakdown of skin: Secondary | ICD-10-CM | POA: Diagnosis not present

## 2018-09-17 DIAGNOSIS — L97512 Non-pressure chronic ulcer of other part of right foot with fat layer exposed: Secondary | ICD-10-CM | POA: Diagnosis not present

## 2018-09-18 ENCOUNTER — Ambulatory Visit
Admission: RE | Admit: 2018-09-18 | Discharge: 2018-09-18 | Disposition: A | Discharge status: Home / Self Care | Payer: Medicare Other | Source: Ambulatory Visit | Attending: Family Medicine | Admitting: Family Medicine

## 2018-09-18 DIAGNOSIS — N6489 Other specified disorders of breast: Secondary | ICD-10-CM | POA: Diagnosis not present

## 2018-09-18 DIAGNOSIS — N6310 Unspecified lump in the right breast, unspecified quadrant: Secondary | ICD-10-CM

## 2018-09-18 DIAGNOSIS — R928 Other abnormal and inconclusive findings on diagnostic imaging of breast: Secondary | ICD-10-CM | POA: Diagnosis not present

## 2018-10-01 DIAGNOSIS — L97511 Non-pressure chronic ulcer of other part of right foot limited to breakdown of skin: Secondary | ICD-10-CM | POA: Diagnosis not present

## 2018-10-03 DIAGNOSIS — M48062 Spinal stenosis, lumbar region with neurogenic claudication: Secondary | ICD-10-CM | POA: Diagnosis not present

## 2018-10-03 DIAGNOSIS — Z6835 Body mass index (BMI) 35.0-35.9, adult: Secondary | ICD-10-CM | POA: Diagnosis not present

## 2018-10-03 DIAGNOSIS — M5416 Radiculopathy, lumbar region: Secondary | ICD-10-CM | POA: Diagnosis not present

## 2018-10-03 DIAGNOSIS — R030 Elevated blood-pressure reading, without diagnosis of hypertension: Secondary | ICD-10-CM | POA: Diagnosis not present

## 2018-10-15 DIAGNOSIS — M71571 Other bursitis, not elsewhere classified, right ankle and foot: Secondary | ICD-10-CM | POA: Diagnosis not present

## 2018-10-22 DIAGNOSIS — J45909 Unspecified asthma, uncomplicated: Secondary | ICD-10-CM | POA: Diagnosis not present

## 2018-10-22 DIAGNOSIS — J3081 Allergic rhinitis due to animal (cat) (dog) hair and dander: Secondary | ICD-10-CM | POA: Diagnosis not present

## 2018-10-22 DIAGNOSIS — F331 Major depressive disorder, recurrent, moderate: Secondary | ICD-10-CM | POA: Diagnosis not present

## 2018-10-22 DIAGNOSIS — M549 Dorsalgia, unspecified: Secondary | ICD-10-CM | POA: Diagnosis not present

## 2018-10-25 DIAGNOSIS — Z79899 Other long term (current) drug therapy: Secondary | ICD-10-CM | POA: Diagnosis not present

## 2018-10-25 DIAGNOSIS — R030 Elevated blood-pressure reading, without diagnosis of hypertension: Secondary | ICD-10-CM | POA: Diagnosis not present

## 2018-10-25 DIAGNOSIS — Z6835 Body mass index (BMI) 35.0-35.9, adult: Secondary | ICD-10-CM | POA: Diagnosis not present

## 2018-10-25 DIAGNOSIS — M5416 Radiculopathy, lumbar region: Secondary | ICD-10-CM | POA: Diagnosis not present

## 2018-10-25 DIAGNOSIS — M48062 Spinal stenosis, lumbar region with neurogenic claudication: Secondary | ICD-10-CM | POA: Diagnosis not present

## 2018-10-31 DIAGNOSIS — M461 Sacroiliitis, not elsewhere classified: Secondary | ICD-10-CM | POA: Diagnosis not present

## 2018-11-02 DIAGNOSIS — R030 Elevated blood-pressure reading, without diagnosis of hypertension: Secondary | ICD-10-CM | POA: Diagnosis not present

## 2018-11-02 DIAGNOSIS — Z6835 Body mass index (BMI) 35.0-35.9, adult: Secondary | ICD-10-CM | POA: Diagnosis not present

## 2018-11-02 DIAGNOSIS — M5416 Radiculopathy, lumbar region: Secondary | ICD-10-CM | POA: Diagnosis not present

## 2018-11-02 DIAGNOSIS — M48062 Spinal stenosis, lumbar region with neurogenic claudication: Secondary | ICD-10-CM | POA: Diagnosis not present

## 2018-11-06 DIAGNOSIS — L565 Disseminated superficial actinic porokeratosis (DSAP): Secondary | ICD-10-CM | POA: Diagnosis not present

## 2018-11-06 DIAGNOSIS — L570 Actinic keratosis: Secondary | ICD-10-CM | POA: Diagnosis not present

## 2018-11-06 DIAGNOSIS — D1801 Hemangioma of skin and subcutaneous tissue: Secondary | ICD-10-CM | POA: Diagnosis not present

## 2018-11-06 DIAGNOSIS — D692 Other nonthrombocytopenic purpura: Secondary | ICD-10-CM | POA: Diagnosis not present

## 2018-11-06 DIAGNOSIS — L821 Other seborrheic keratosis: Secondary | ICD-10-CM | POA: Diagnosis not present

## 2018-11-08 DIAGNOSIS — M5127 Other intervertebral disc displacement, lumbosacral region: Secondary | ICD-10-CM | POA: Diagnosis not present

## 2018-11-08 DIAGNOSIS — M5416 Radiculopathy, lumbar region: Secondary | ICD-10-CM | POA: Diagnosis not present

## 2018-11-08 DIAGNOSIS — M4807 Spinal stenosis, lumbosacral region: Secondary | ICD-10-CM | POA: Diagnosis not present

## 2018-11-14 DIAGNOSIS — M5416 Radiculopathy, lumbar region: Secondary | ICD-10-CM | POA: Diagnosis not present

## 2018-11-19 DIAGNOSIS — M5416 Radiculopathy, lumbar region: Secondary | ICD-10-CM | POA: Diagnosis not present

## 2018-11-19 DIAGNOSIS — M48062 Spinal stenosis, lumbar region with neurogenic claudication: Secondary | ICD-10-CM | POA: Diagnosis not present

## 2018-11-28 DIAGNOSIS — M2041 Other hammer toe(s) (acquired), right foot: Secondary | ICD-10-CM | POA: Diagnosis not present

## 2018-11-28 DIAGNOSIS — M79671 Pain in right foot: Secondary | ICD-10-CM | POA: Diagnosis not present

## 2018-11-28 DIAGNOSIS — M2040 Other hammer toe(s) (acquired), unspecified foot: Secondary | ICD-10-CM | POA: Insufficient documentation

## 2018-11-28 DIAGNOSIS — M7741 Metatarsalgia, right foot: Secondary | ICD-10-CM | POA: Diagnosis not present

## 2019-02-05 DIAGNOSIS — M5416 Radiculopathy, lumbar region: Secondary | ICD-10-CM | POA: Diagnosis not present

## 2019-02-05 DIAGNOSIS — M545 Low back pain: Secondary | ICD-10-CM | POA: Diagnosis not present

## 2019-03-06 DIAGNOSIS — Z1159 Encounter for screening for other viral diseases: Secondary | ICD-10-CM | POA: Diagnosis not present

## 2019-03-12 DIAGNOSIS — Z9889 Other specified postprocedural states: Secondary | ICD-10-CM | POA: Diagnosis not present

## 2019-03-12 DIAGNOSIS — M5116 Intervertebral disc disorders with radiculopathy, lumbar region: Secondary | ICD-10-CM | POA: Diagnosis not present

## 2019-03-12 DIAGNOSIS — M48062 Spinal stenosis, lumbar region with neurogenic claudication: Secondary | ICD-10-CM | POA: Diagnosis not present

## 2019-03-26 DIAGNOSIS — L565 Disseminated superficial actinic porokeratosis (DSAP): Secondary | ICD-10-CM | POA: Diagnosis not present

## 2019-03-26 DIAGNOSIS — L570 Actinic keratosis: Secondary | ICD-10-CM | POA: Diagnosis not present

## 2019-04-10 ENCOUNTER — Other Ambulatory Visit: Payer: Self-pay

## 2019-05-27 DIAGNOSIS — M48062 Spinal stenosis, lumbar region with neurogenic claudication: Secondary | ICD-10-CM | POA: Diagnosis not present

## 2019-05-27 DIAGNOSIS — L97512 Non-pressure chronic ulcer of other part of right foot with fat layer exposed: Secondary | ICD-10-CM | POA: Diagnosis not present

## 2019-05-27 DIAGNOSIS — L565 Disseminated superficial actinic porokeratosis (DSAP): Secondary | ICD-10-CM | POA: Diagnosis not present

## 2019-05-27 DIAGNOSIS — J45909 Unspecified asthma, uncomplicated: Secondary | ICD-10-CM | POA: Diagnosis not present

## 2019-05-27 DIAGNOSIS — F339 Major depressive disorder, recurrent, unspecified: Secondary | ICD-10-CM | POA: Diagnosis not present

## 2019-05-27 DIAGNOSIS — M5116 Intervertebral disc disorders with radiculopathy, lumbar region: Secondary | ICD-10-CM | POA: Diagnosis not present

## 2019-05-27 DIAGNOSIS — M5416 Radiculopathy, lumbar region: Secondary | ICD-10-CM | POA: Diagnosis not present

## 2019-05-27 DIAGNOSIS — J449 Chronic obstructive pulmonary disease, unspecified: Secondary | ICD-10-CM | POA: Diagnosis not present

## 2019-05-27 DIAGNOSIS — I700 Atherosclerosis of aorta: Secondary | ICD-10-CM | POA: Diagnosis not present

## 2019-05-27 DIAGNOSIS — M549 Dorsalgia, unspecified: Secondary | ICD-10-CM | POA: Diagnosis not present

## 2019-05-27 DIAGNOSIS — E669 Obesity, unspecified: Secondary | ICD-10-CM | POA: Diagnosis not present

## 2019-05-27 DIAGNOSIS — J3081 Allergic rhinitis due to animal (cat) (dog) hair and dander: Secondary | ICD-10-CM | POA: Diagnosis not present

## 2019-05-31 DIAGNOSIS — L570 Actinic keratosis: Secondary | ICD-10-CM | POA: Diagnosis not present

## 2019-06-07 DIAGNOSIS — J3081 Allergic rhinitis due to animal (cat) (dog) hair and dander: Secondary | ICD-10-CM | POA: Diagnosis not present

## 2019-06-07 DIAGNOSIS — J342 Deviated nasal septum: Secondary | ICD-10-CM | POA: Diagnosis not present

## 2019-06-07 DIAGNOSIS — R05 Cough: Secondary | ICD-10-CM | POA: Diagnosis not present

## 2019-06-07 DIAGNOSIS — J3089 Other allergic rhinitis: Secondary | ICD-10-CM | POA: Diagnosis not present

## 2019-06-17 DIAGNOSIS — M2041 Other hammer toe(s) (acquired), right foot: Secondary | ICD-10-CM | POA: Diagnosis not present

## 2019-06-17 DIAGNOSIS — M79671 Pain in right foot: Secondary | ICD-10-CM | POA: Diagnosis not present

## 2019-06-17 DIAGNOSIS — M7741 Metatarsalgia, right foot: Secondary | ICD-10-CM | POA: Insufficient documentation

## 2019-06-19 ENCOUNTER — Other Ambulatory Visit (HOSPITAL_COMMUNITY): Payer: Self-pay | Admitting: Orthopedic Surgery

## 2019-06-27 DIAGNOSIS — M7070 Other bursitis of hip, unspecified hip: Secondary | ICD-10-CM | POA: Diagnosis not present

## 2019-06-27 DIAGNOSIS — M461 Sacroiliitis, not elsewhere classified: Secondary | ICD-10-CM | POA: Diagnosis not present

## 2019-06-28 DIAGNOSIS — L570 Actinic keratosis: Secondary | ICD-10-CM | POA: Diagnosis not present

## 2019-07-02 DIAGNOSIS — J342 Deviated nasal septum: Secondary | ICD-10-CM | POA: Diagnosis not present

## 2019-07-02 DIAGNOSIS — J343 Hypertrophy of nasal turbinates: Secondary | ICD-10-CM | POA: Diagnosis not present

## 2019-07-02 DIAGNOSIS — K1123 Chronic sialoadenitis: Secondary | ICD-10-CM | POA: Diagnosis not present

## 2019-07-02 DIAGNOSIS — J310 Chronic rhinitis: Secondary | ICD-10-CM | POA: Diagnosis not present

## 2019-07-03 DIAGNOSIS — M461 Sacroiliitis, not elsewhere classified: Secondary | ICD-10-CM | POA: Diagnosis not present

## 2019-07-07 IMAGING — XA DG MYELOGRAPHY LUMBAR INJ LUMBOSACRAL
7 of 16 series · 7 of 16 positions shown · non-contrast
Comparison: Lumbar spine MRI 03/24/2016

CLINICAL DATA: Pain in the right low back and buttock. Right leg
pain and numbness.
TECHNIQUE: Contiguous axial images were obtained through the Lumbar spine after
the intrathecal infusion of infusion. Coronal and sagittal
reconstructions were obtained of the axial image sets.

[Series 1: vasc standard · 1 of 1 slices shown (1 of 4)]
[im 1/1]
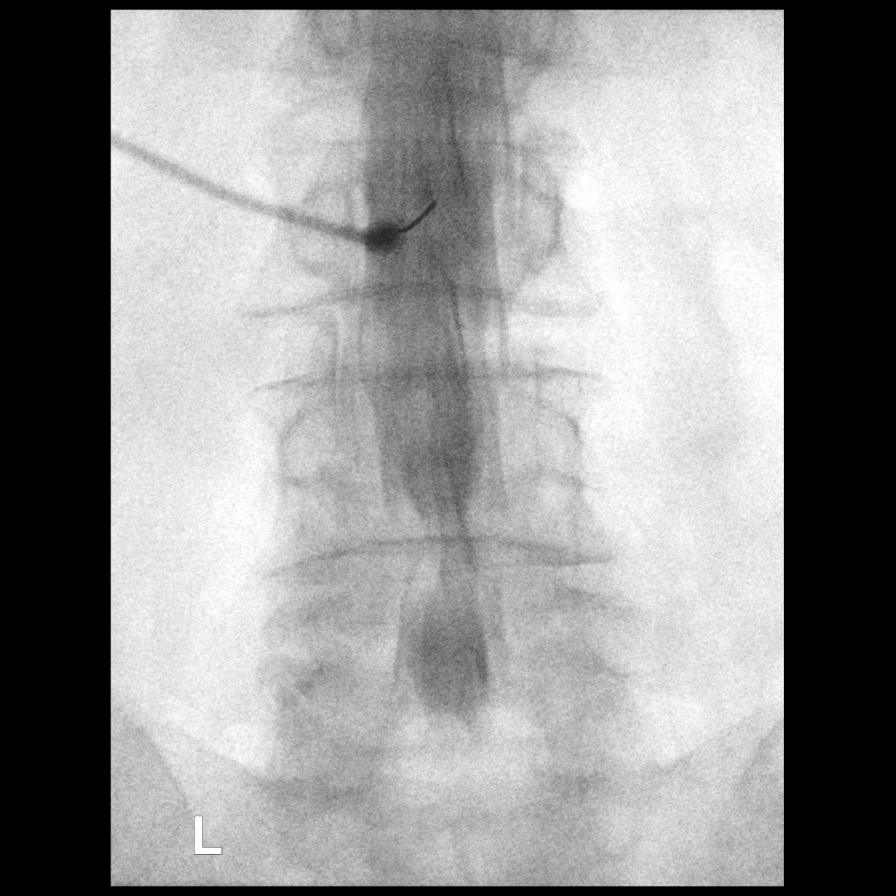

[Series 1: w lumbar spine lat · 0.15mm/px · 1 of 1 slices shown]
[im 1/1]
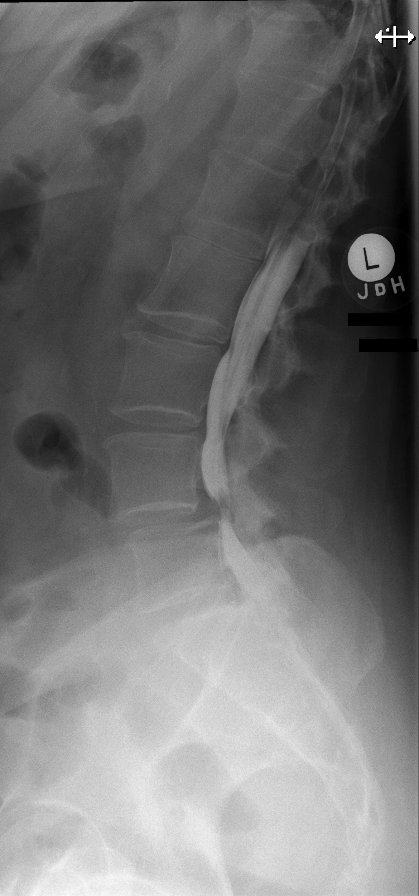

[Series 2: vasc standard · 1 of 1 slices shown (2 of 4)]
[im 1/1]
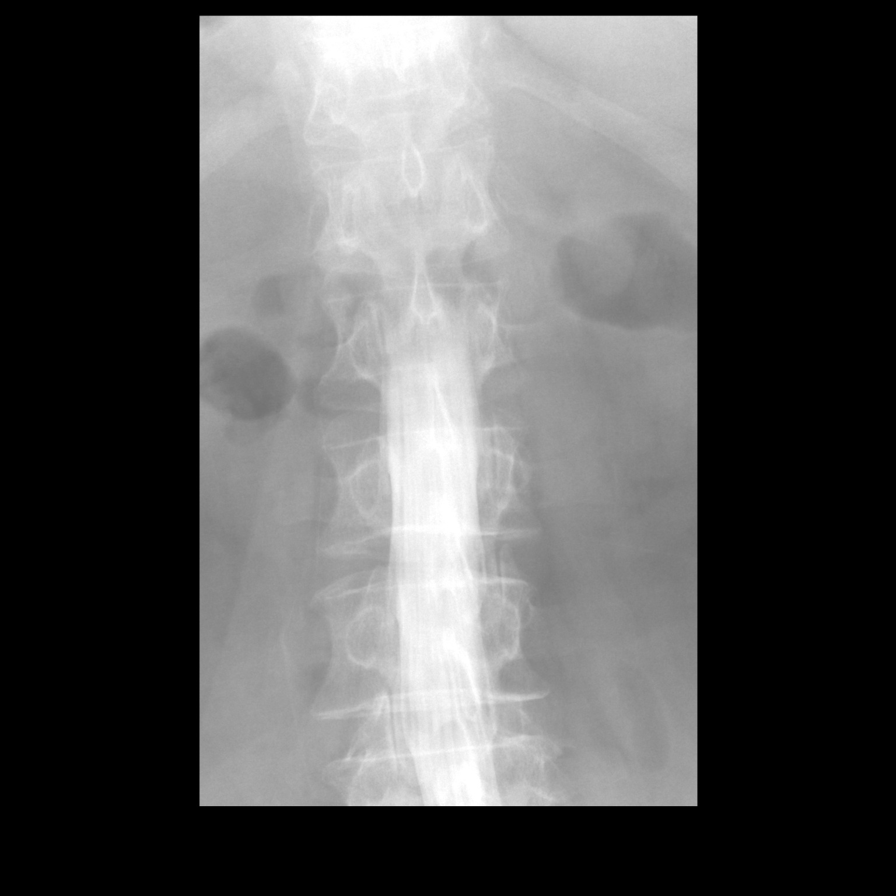

[Series 2: w lumbar spine flexion · 0.15mm/px · 1 of 1 slices shown]
[im 1/1]
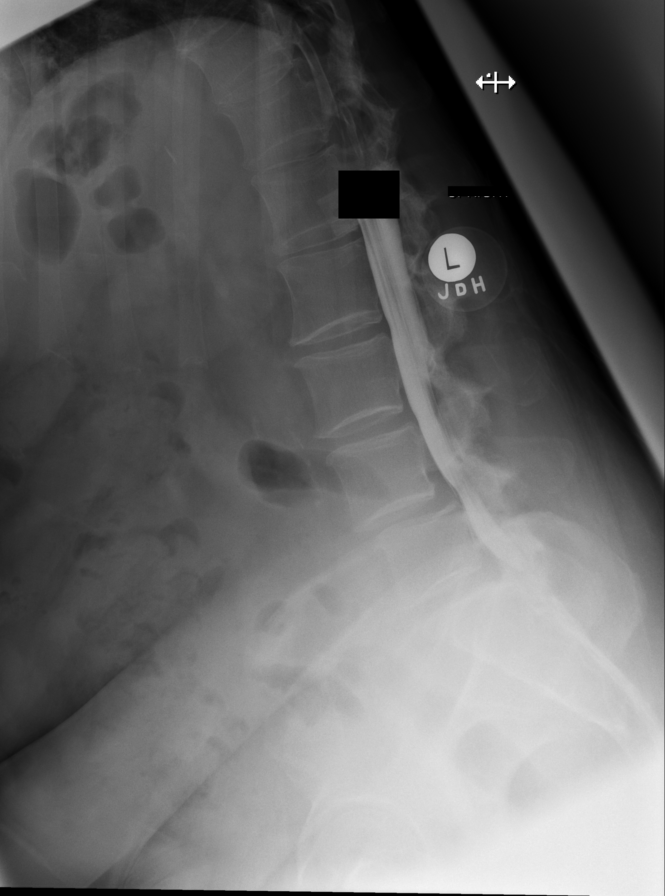

[Series 3: w lumbar spine extension · 0.15mm/px · 1 of 1 slices shown]
[im 1/1]
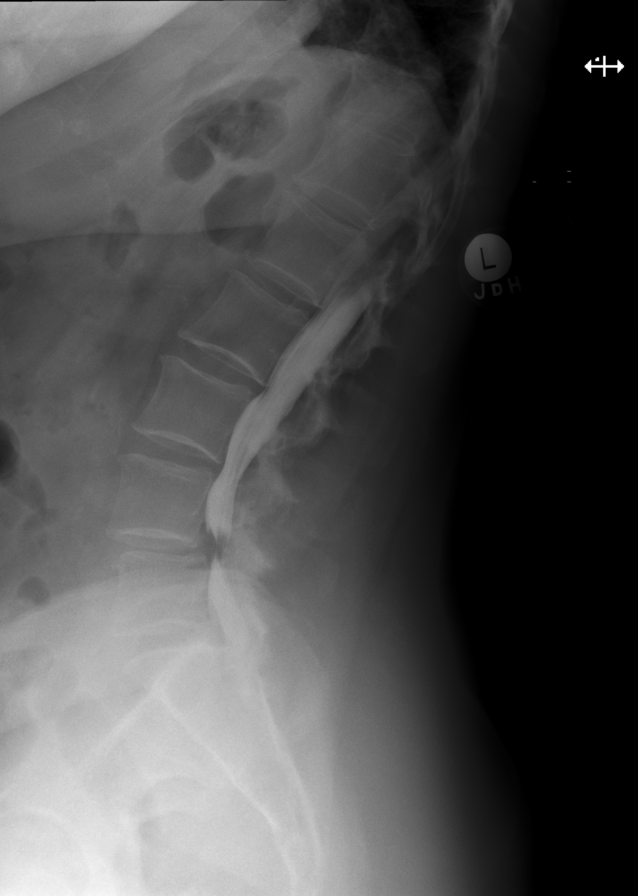

[Series 3: vasc standard · 1 of 1 slices shown (3 of 4)]
[im 1/1]
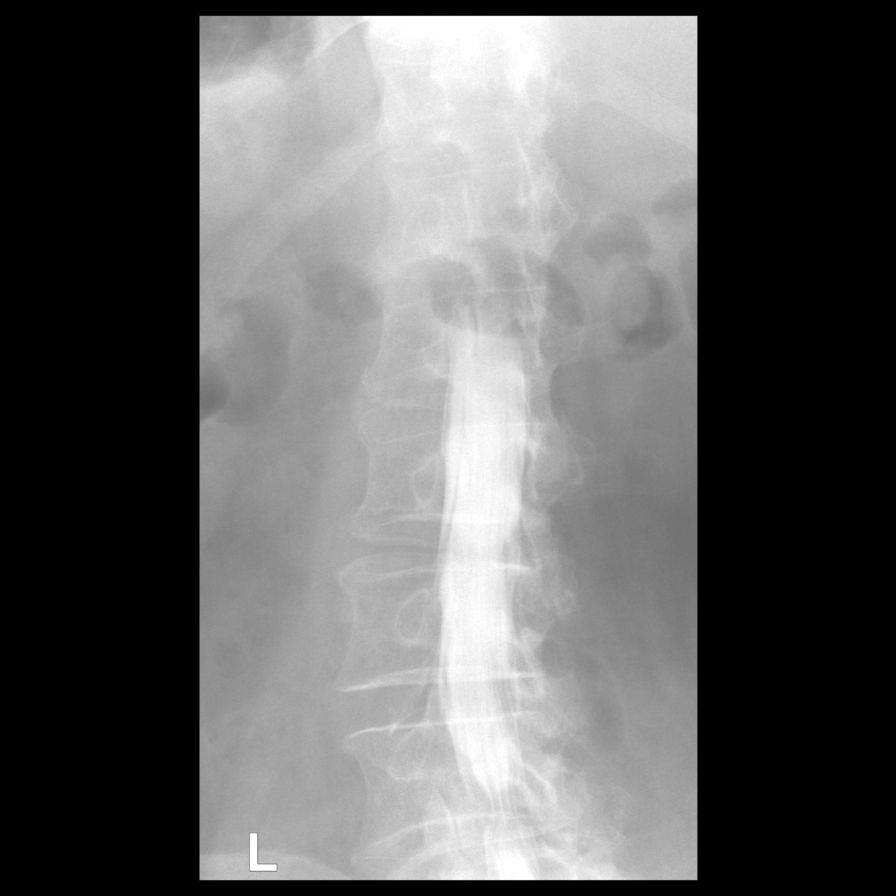

[Series 4: vasc standard · 1 of 1 slices shown (4 of 4)]
[im 1/1]
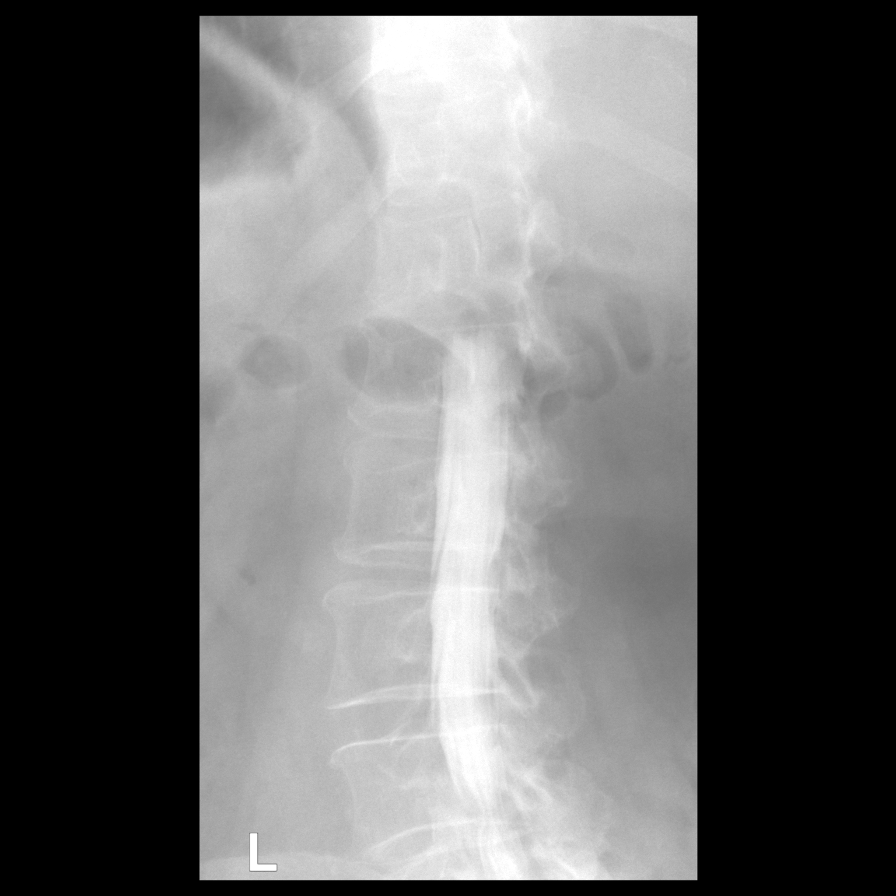

[7 of 16 positions shown; findings below may reference images not displayed]

EXAM:
LUMBAR MYELOGRAM

FLUOROSCOPY TIME:  Radiation Exposure Index (as provided by the
fluoroscopic device): 326.97 microGray*m^2

Fluoroscopy Time (in minutes and seconds):  29 seconds

PROCEDURE:
After thorough discussion of risks and benefits of the procedure
including bleeding, infection, injury to nerves, blood vessels,
adjacent structures as well as headache and CSF leak, written and
oral informed consent was obtained. Consent was obtained by Dr.
Kestenovi Huis. Time out form was completed.

Patient was positioned prone on the fluoroscopy table. Local
anesthesia was provided with 1% lidocaine without epinephrine after
prepped and draped in the usual sterile fashion. Puncture was
performed at L2-3 using a 3 1/2 inch 22-gauge spinal needle via a
left interlaminar approach. Using a single pass through the dura,
the needle was placed within the thecal sac, with return of clear
CSF. 15 mL of Isovue 2-7EE was injected into the thecal sac, with
normal opacification of the nerve roots and cauda equina consistent
with free flow within the subarachnoid space.

I personally performed the lumbar puncture and administered the
intrathecal contrast. I also personally supervised acquisition of
the myelogram images.
FINDINGS: LUMBAR MYELOGRAM FINDINGS:

Lumbar segmentation is normal. There is grade 1 anterolisthesis of
L4 on L5 which increases with standing. This does not significantly
increase further with flexion and slightly reduces with extension.
There is evidence of severe spinal at L4-5 with effacement of the L5
nerve root sleeves. A shallow ventral extradural defect is present
at L3-4.

CT LUMBAR MYELOGRAM FINDINGS:

Facet mediated anterolisthesis of L4 on L5 measures 4 mm on this
supine CT, unchanged from the prior MRI. There is slight levoconvex
curvature of the lumbar spine centered at L3-4. Vertebral body
heights are preserved without evidence of fracture or destructive
osseous process. The conus medullaris terminates at T12-L1. Mild
abdominal aortic atherosclerosis is noted without aneurysm.

T12-L1:  Negative.

L1-2: Minimal right facet spurring without disc herniation or
stenosis.

L2-3: Mild disc bulging and mild right greater than left without
stenosis, similar to the prior MRI.

L3-4: Mild disc bulging and mild right and mild-to-moderate left
facet hypertrophy without stenosis, unchanged.

L4-5: Mild disc space narrowing. Anterolisthesis with bulging
uncovered disc, mild ligamentum flavum thickening, and severe facet
arthrosis result in severe spinal stenosis and left greater than
right lateral recess stenosis with potential bilateral L5 nerve root
impingement, unchanged. Mild right neural foraminal stenosis is also
unchanged.

L5-S1: Mild disc bulging and moderate left greater than right facet
arthrosis result in mild left lateral recess stenosis without spinal
or neural foraminal stenosis, unchanged.
IMPRESSION: 1. Severe L4-5 facet arthrosis with grade 1 anterolisthesis which
increases with standing. Severe spinal stenosis with bilateral L5
nerve root impingement.
2. Mild disc and moderate facet degeneration elsewhere as above.
3.  Aortic Atherosclerosis ([id number]).

## 2019-07-08 DIAGNOSIS — Z136 Encounter for screening for cardiovascular disorders: Secondary | ICD-10-CM | POA: Diagnosis not present

## 2019-07-08 DIAGNOSIS — M5116 Intervertebral disc disorders with radiculopathy, lumbar region: Secondary | ICD-10-CM | POA: Diagnosis not present

## 2019-07-08 DIAGNOSIS — L570 Actinic keratosis: Secondary | ICD-10-CM | POA: Diagnosis not present

## 2019-07-08 DIAGNOSIS — Z23 Encounter for immunization: Secondary | ICD-10-CM | POA: Diagnosis not present

## 2019-07-08 DIAGNOSIS — I700 Atherosclerosis of aorta: Secondary | ICD-10-CM | POA: Diagnosis not present

## 2019-07-08 DIAGNOSIS — Z0000 Encounter for general adult medical examination without abnormal findings: Secondary | ICD-10-CM | POA: Diagnosis not present

## 2019-07-08 DIAGNOSIS — M2041 Other hammer toe(s) (acquired), right foot: Secondary | ICD-10-CM | POA: Diagnosis not present

## 2019-07-08 DIAGNOSIS — J3081 Allergic rhinitis due to animal (cat) (dog) hair and dander: Secondary | ICD-10-CM | POA: Diagnosis not present

## 2019-07-08 DIAGNOSIS — J342 Deviated nasal septum: Secondary | ICD-10-CM | POA: Diagnosis not present

## 2019-07-25 ENCOUNTER — Encounter (HOSPITAL_BASED_OUTPATIENT_CLINIC_OR_DEPARTMENT_OTHER): Payer: Self-pay | Admitting: *Deleted

## 2019-07-25 ENCOUNTER — Other Ambulatory Visit: Payer: Self-pay

## 2019-07-29 ENCOUNTER — Other Ambulatory Visit (HOSPITAL_COMMUNITY)
Admission: RE | Admit: 2019-07-29 | Discharge: 2019-07-29 | Disposition: A | Discharge status: Home / Self Care | Payer: Medicare Other | Source: Ambulatory Visit | Attending: Orthopedic Surgery | Admitting: Orthopedic Surgery

## 2019-07-29 DIAGNOSIS — Z01812 Encounter for preprocedural laboratory examination: Secondary | ICD-10-CM | POA: Insufficient documentation

## 2019-07-29 DIAGNOSIS — Z20828 Contact with and (suspected) exposure to other viral communicable diseases: Secondary | ICD-10-CM | POA: Insufficient documentation

## 2019-07-29 NOTE — Progress Notes (Signed)

## 2019-07-30 LAB — NOVEL CORONAVIRUS, NAA (HOSP ORDER, SEND-OUT TO REF LAB; TAT 18-24 HRS): SARS-CoV-2, NAA: NOT DETECTED

## 2019-08-01 ENCOUNTER — Encounter (HOSPITAL_BASED_OUTPATIENT_CLINIC_OR_DEPARTMENT_OTHER): Payer: Self-pay

## 2019-08-01 ENCOUNTER — Ambulatory Visit (HOSPITAL_BASED_OUTPATIENT_CLINIC_OR_DEPARTMENT_OTHER): Payer: Medicare Other | Admitting: Certified Registered"

## 2019-08-01 ENCOUNTER — Encounter (HOSPITAL_BASED_OUTPATIENT_CLINIC_OR_DEPARTMENT_OTHER)
Admission: RE | Disposition: A | Discharge status: Home / Self Care | Payer: Self-pay | Source: Home / Self Care | Attending: Orthopedic Surgery

## 2019-08-01 ENCOUNTER — Other Ambulatory Visit: Payer: Self-pay

## 2019-08-01 ENCOUNTER — Ambulatory Visit (HOSPITAL_BASED_OUTPATIENT_CLINIC_OR_DEPARTMENT_OTHER)
Admission: RE | Admit: 2019-08-01 | Discharge: 2019-08-01 | Disposition: A | Discharge status: Home / Self Care | Payer: Medicare Other | Attending: Orthopedic Surgery | Admitting: Orthopedic Surgery

## 2019-08-01 DIAGNOSIS — Z803 Family history of malignant neoplasm of breast: Secondary | ICD-10-CM | POA: Diagnosis not present

## 2019-08-01 DIAGNOSIS — M7741 Metatarsalgia, right foot: Secondary | ICD-10-CM | POA: Diagnosis not present

## 2019-08-01 DIAGNOSIS — M2041 Other hammer toe(s) (acquired), right foot: Secondary | ICD-10-CM | POA: Diagnosis not present

## 2019-08-01 DIAGNOSIS — E669 Obesity, unspecified: Secondary | ICD-10-CM | POA: Diagnosis not present

## 2019-08-01 DIAGNOSIS — Z888 Allergy status to other drugs, medicaments and biological substances status: Secondary | ICD-10-CM | POA: Diagnosis not present

## 2019-08-01 DIAGNOSIS — Z7982 Long term (current) use of aspirin: Secondary | ICD-10-CM | POA: Diagnosis not present

## 2019-08-01 DIAGNOSIS — Z7951 Long term (current) use of inhaled steroids: Secondary | ICD-10-CM | POA: Insufficient documentation

## 2019-08-01 DIAGNOSIS — Z880 Allergy status to penicillin: Secondary | ICD-10-CM | POA: Insufficient documentation

## 2019-08-01 DIAGNOSIS — E785 Hyperlipidemia, unspecified: Secondary | ICD-10-CM | POA: Diagnosis not present

## 2019-08-01 DIAGNOSIS — F329 Major depressive disorder, single episode, unspecified: Secondary | ICD-10-CM | POA: Insufficient documentation

## 2019-08-01 DIAGNOSIS — G8918 Other acute postprocedural pain: Secondary | ICD-10-CM | POA: Diagnosis not present

## 2019-08-01 DIAGNOSIS — Z79899 Other long term (current) drug therapy: Secondary | ICD-10-CM | POA: Diagnosis not present

## 2019-08-01 DIAGNOSIS — J45909 Unspecified asthma, uncomplicated: Secondary | ICD-10-CM | POA: Insufficient documentation

## 2019-08-01 DIAGNOSIS — Z6833 Body mass index (BMI) 33.0-33.9, adult: Secondary | ICD-10-CM | POA: Diagnosis not present

## 2019-08-01 HISTORY — PX: HAMMERTOE RECONSTRUCTION WITH WEIL OSTEOTOMY: SHX5631

## 2019-08-01 HISTORY — DX: Unspecified asthma, uncomplicated: J45.909

## 2019-08-01 SURGERY — HAMMERTOE RECONSTRUCTION WITH WEIL OSTEOTOMY
Anesthesia: Monitor Anesthesia Care | Site: Foot | Laterality: Right

## 2019-08-01 MED ORDER — OXYCODONE HCL 5 MG PO TABS: 5.0000 mg | ORAL_TABLET | Freq: Four times a day (QID) | ORAL | 0 refills | Status: AC | PRN

## 2019-08-01 MED ORDER — ONDANSETRON HCL 4 MG/2ML IJ SOLN
INTRAMUSCULAR | Status: DC | PRN
  Administered 2019-08-01: 4 mg via INTRAVENOUS

## 2019-08-01 MED ORDER — CHLORHEXIDINE GLUCONATE 4 % EX LIQD: 60.0000 mL | Freq: Once | CUTANEOUS | Status: DC

## 2019-08-01 MED ORDER — HYDROMORPHONE HCL 1 MG/ML IJ SOLN: 0.2500 mg | INTRAMUSCULAR | Status: DC | PRN

## 2019-08-01 MED ORDER — CEFAZOLIN SODIUM-DEXTROSE 2-4 GM/100ML-% IV SOLN
INTRAVENOUS | Status: AC
  Filled 2019-08-01: qty 100

## 2019-08-01 MED ORDER — ACETAMINOPHEN 500 MG PO TABS: 1000.0000 mg | ORAL_TABLET | Freq: Once | ORAL | Status: DC

## 2019-08-01 MED ORDER — ROPIVACAINE HCL 5 MG/ML IJ SOLN
INTRAMUSCULAR | Status: DC | PRN
  Administered 2019-08-01: 40 mL via PERINEURAL

## 2019-08-01 MED ORDER — MIDAZOLAM HCL 2 MG/2ML IJ SOLN
INTRAMUSCULAR | Status: AC
  Filled 2019-08-01: qty 2

## 2019-08-01 MED ORDER — OXYCODONE HCL 5 MG/5ML PO SOLN: 5.0000 mg | Freq: Once | ORAL | Status: DC | PRN

## 2019-08-01 MED ORDER — LACTATED RINGERS IV SOLN
INTRAVENOUS | Status: DC
  Administered 2019-08-01 (×2): via INTRAVENOUS

## 2019-08-01 MED ORDER — SENNA 8.6 MG PO TABS: 2.0000 | ORAL_TABLET | Freq: Two times a day (BID) | ORAL | 0 refills | Status: DC

## 2019-08-01 MED ORDER — CEFAZOLIN SODIUM-DEXTROSE 2-4 GM/100ML-% IV SOLN
2.0000 g | INTRAVENOUS | Status: AC
  Administered 2019-08-01: 2 g via INTRAVENOUS

## 2019-08-01 MED ORDER — 0.9 % SODIUM CHLORIDE (POUR BTL) OPTIME
TOPICAL | Status: DC | PRN
  Administered 2019-08-01: 100 mL

## 2019-08-01 MED ORDER — OXYCODONE HCL 5 MG PO TABS: 5.0000 mg | ORAL_TABLET | Freq: Once | ORAL | Status: DC | PRN

## 2019-08-01 MED ORDER — FENTANYL CITRATE (PF) 100 MCG/2ML IJ SOLN
50.0000 ug | INTRAMUSCULAR | Status: DC | PRN
  Administered 2019-08-01: 50 ug via INTRAVENOUS

## 2019-08-01 MED ORDER — SODIUM CHLORIDE 0.9 % IV SOLN: INTRAVENOUS | Status: DC

## 2019-08-01 MED ORDER — KETOROLAC TROMETHAMINE 30 MG/ML IJ SOLN: 30.0000 mg | Freq: Once | INTRAMUSCULAR | Status: DC | PRN

## 2019-08-01 MED ORDER — FENTANYL CITRATE (PF) 100 MCG/2ML IJ SOLN
INTRAMUSCULAR | Status: AC
  Filled 2019-08-01: qty 2

## 2019-08-01 MED ORDER — DEXAMETHASONE SODIUM PHOSPHATE 10 MG/ML IJ SOLN
INTRAMUSCULAR | Status: DC | PRN
  Administered 2019-08-01: 10 mg
  Administered 2019-08-01: 5 mg via INTRAVENOUS

## 2019-08-01 MED ORDER — ONDANSETRON HCL 4 MG/2ML IJ SOLN: 4.0000 mg | Freq: Once | INTRAMUSCULAR | Status: DC | PRN

## 2019-08-01 MED ORDER — BUPIVACAINE HCL (PF) 0.5 % IJ SOLN
INTRAMUSCULAR | Status: AC
  Filled 2019-08-01: qty 30

## 2019-08-01 MED ORDER — MIDAZOLAM HCL 2 MG/2ML IJ SOLN
1.0000 mg | INTRAMUSCULAR | Status: DC | PRN
  Administered 2019-08-01: 1 mg via INTRAVENOUS

## 2019-08-01 MED ORDER — PROPOFOL 10 MG/ML IV BOLUS
INTRAVENOUS | Status: DC | PRN
  Administered 2019-08-01: 150 mg via INTRAVENOUS
  Administered 2019-08-01: 40 mg via INTRAVENOUS

## 2019-08-01 MED ORDER — DOCUSATE SODIUM 100 MG PO CAPS: 100.0000 mg | ORAL_CAPSULE | Freq: Two times a day (BID) | ORAL | 0 refills | Status: DC

## 2019-08-01 SURGICAL SUPPLY — 74 items
BANDAGE ESMARK 6X9 LF (GAUZE/BANDAGES/DRESSINGS) IMPLANT
BIT DRILL 1.8 CANN MAX VPC (BIT) ×2 IMPLANT
BLADE AVERAGE 25X9 (BLADE) IMPLANT
BLADE LONG MED 25X9 (BLADE) ×2 IMPLANT
BLADE OSC/SAG .038X5.5 CUT EDG (BLADE) IMPLANT
BLADE SURG 15 STRL LF DISP TIS (BLADE) ×3 IMPLANT
BLADE SURG 15 STRL SS (BLADE) ×3
BNDG COHESIVE 4X5 TAN STRL (GAUZE/BANDAGES/DRESSINGS) ×2 IMPLANT
BNDG CONFORM 2 STRL LF (GAUZE/BANDAGES/DRESSINGS) IMPLANT
BNDG CONFORM 3 STRL LF (GAUZE/BANDAGES/DRESSINGS) ×2 IMPLANT
BNDG ESMARK 4X9 LF (GAUZE/BANDAGES/DRESSINGS) IMPLANT
BNDG ESMARK 6X9 LF (GAUZE/BANDAGES/DRESSINGS)
CAP PIN PROTECTOR ORTHO WHT (CAP) IMPLANT
CHLORAPREP W/TINT 26 (MISCELLANEOUS) ×2 IMPLANT
COVER BACK TABLE REUSABLE LG (DRAPES) ×2 IMPLANT
COVER WAND RF STERILE (DRAPES) IMPLANT
CUFF TOURN SGL QUICK 34 (TOURNIQUET CUFF)
CUFF TOURN SGL QUICK 42 (TOURNIQUET CUFF) ×2 IMPLANT
CUFF TRNQT CYL 34X4.125X (TOURNIQUET CUFF) IMPLANT
DRAPE EXTREMITY T 121X128X90 (DISPOSABLE) ×2 IMPLANT
DRAPE HALF SHEET 70X43 (DRAPES) ×2 IMPLANT
DRAPE OEC MINIVIEW 54X84 (DRAPES) ×2 IMPLANT
DRAPE U-SHAPE 47X51 STRL (DRAPES) ×2 IMPLANT
DRSG MEPITEL 4X7.2 (GAUZE/BANDAGES/DRESSINGS) ×2 IMPLANT
DRSG PAD ABDOMINAL 8X10 ST (GAUZE/BANDAGES/DRESSINGS) ×2 IMPLANT
ELECT REM PT RETURN 9FT ADLT (ELECTROSURGICAL) ×2
ELECTRODE REM PT RTRN 9FT ADLT (ELECTROSURGICAL) ×1 IMPLANT
GAUZE SPONGE 4X4 12PLY STRL (GAUZE/BANDAGES/DRESSINGS) ×2 IMPLANT
GLOVE BIO SURGEON STRL SZ7 (GLOVE) ×2 IMPLANT
GLOVE BIO SURGEON STRL SZ8 (GLOVE) ×2 IMPLANT
GLOVE BIOGEL PI IND STRL 7.0 (GLOVE) ×1 IMPLANT
GLOVE BIOGEL PI IND STRL 7.5 (GLOVE) ×1 IMPLANT
GLOVE BIOGEL PI IND STRL 8 (GLOVE) ×2 IMPLANT
GLOVE BIOGEL PI INDICATOR 7.0 (GLOVE) ×1
GLOVE BIOGEL PI INDICATOR 7.5 (GLOVE) ×1
GLOVE BIOGEL PI INDICATOR 8 (GLOVE) ×2
GLOVE ECLIPSE 8.0 STRL XLNG CF (GLOVE) ×2 IMPLANT
GOWN STRL REUS W/ TWL LRG LVL3 (GOWN DISPOSABLE) IMPLANT
GOWN STRL REUS W/ TWL XL LVL3 (GOWN DISPOSABLE) ×3 IMPLANT
GOWN STRL REUS W/TWL LRG LVL3 (GOWN DISPOSABLE)
GOWN STRL REUS W/TWL XL LVL3 (GOWN DISPOSABLE) ×5 IMPLANT
K-WIRE .054X4 (WIRE) IMPLANT
K-WIRE COCR 0.9X95 (WIRE) ×2
KWIRE COCR 0.9X95 (WIRE) ×1 IMPLANT
NEEDLE HYPO 22GX1.5 SAFETY (NEEDLE) IMPLANT
NS IRRIG 1000ML POUR BTL (IV SOLUTION) ×2 IMPLANT
PACK BASIN DAY SURGERY FS (CUSTOM PROCEDURE TRAY) ×2 IMPLANT
PAD CAST 4YDX4 CTTN HI CHSV (CAST SUPPLIES) ×1 IMPLANT
PADDING CAST ABS 4INX4YD NS (CAST SUPPLIES)
PADDING CAST ABS COTTON 4X4 ST (CAST SUPPLIES) IMPLANT
PADDING CAST COTTON 4X4 STRL (CAST SUPPLIES) ×1
PASSER SUT SWANSON 36MM LOOP (INSTRUMENTS) IMPLANT
PENCIL SMOKE EVACUATOR (MISCELLANEOUS) ×2 IMPLANT
SANITIZER HAND PURELL 535ML FO (MISCELLANEOUS) ×2 IMPLANT
SCREW HCS TWIST-OFF 2.0X10MM (Screw) ×4 IMPLANT
SCREW HCS TWIST-OFF 2.0X12MM (Screw) ×4 IMPLANT
SCREW VPC 2.5X14MM (Screw) ×4 IMPLANT
SLEEVE SCD COMPRESS KNEE MED (MISCELLANEOUS) ×2 IMPLANT
SPONGE LAP 18X18 RF (DISPOSABLE) ×2 IMPLANT
STOCKINETTE 6  STRL (DRAPES) ×1
STOCKINETTE 6 STRL (DRAPES) ×1 IMPLANT
SUCTION FRAZIER HANDLE 10FR (MISCELLANEOUS) ×1
SUCTION TUBE FRAZIER 10FR DISP (MISCELLANEOUS) ×1 IMPLANT
SUT ETHILON 3 0 PS 1 (SUTURE) ×4 IMPLANT
SUT MNCRL AB 3-0 PS2 18 (SUTURE) ×2 IMPLANT
SUT VIC AB 2-0 SH 27 (SUTURE)
SUT VIC AB 2-0 SH 27XBRD (SUTURE) IMPLANT
SUT VICRYL 0 UR6 27IN ABS (SUTURE) IMPLANT
SYR BULB 3OZ (MISCELLANEOUS) ×2 IMPLANT
SYR CONTROL 10ML LL (SYRINGE) IMPLANT
TOWEL GREEN STERILE FF (TOWEL DISPOSABLE) ×2 IMPLANT
TUBE CONNECTING 20X1/4 (TUBING) ×2 IMPLANT
UNDERPAD 30X36 HEAVY ABSORB (UNDERPADS AND DIAPERS) ×2 IMPLANT
YANKAUER SUCT BULB TIP NO VENT (SUCTIONS) IMPLANT

## 2019-08-01 NOTE — Anesthesia Procedure Notes (Signed)
Anesthesia Regional Block: Popliteal block   Pre-Anesthetic Checklist: ,, timeout performed, Correct Patient, Correct Site, Correct Laterality, Correct Procedure, Correct Position, site marked, Risks and benefits discussed,  Surgical consent,  Pre-op evaluation,  At surgeon's request and post-op pain management  Laterality: Right  Prep: Maximum Sterile Barrier Precautions used, chloraprep       Needles:  Injection technique: Single-shot  Needle Type: Echogenic Stimulator Needle     Needle Length: 9cm  Needle Gauge: 22     Additional Needles:   Procedures:,,,, ultrasound used (permanent image in chart),,,,  Narrative:  Start time: 08/01/2019 9:00 AM End time: 08/01/2019 9:10 AM Injection made incrementally with aspirations every 5 mL.  Performed by: Personally  Anesthesiologist: Pervis Hocking, DO  Additional Notes: Monitors applied. No increased pain on injection. No increased resistance to injection. Injection made in 5cc increments. Good needle visualization. Patient tolerated procedure well.

## 2019-08-01 NOTE — Anesthesia Preprocedure Evaluation (Addendum)
Anesthesia Evaluation  Patient identified by MRN, date of birth, ID band Patient awake    Reviewed: Allergy & Precautions, NPO status , Patient's Chart, lab work & pertinent test results  Airway Mallampati: II  TM Distance: >3 FB Neck ROM: Full    Dental  (+) Teeth Intact, Dental Advisory Given,    Pulmonary asthma ,  Inhalers: symbicort singulair   Pulmonary exam normal breath sounds clear to auscultation       Cardiovascular negative cardio ROS Normal cardiovascular exam Rhythm:Regular Rate:Normal     Neuro/Psych PSYCHIATRIC DISORDERS Depression negative neurological ROS     GI/Hepatic negative GI ROS, Neg liver ROS,   Endo/Other  negative endocrine ROS  Renal/GU negative Renal ROS  negative genitourinary   Musculoskeletal negative musculoskeletal ROS (+)   Abdominal (+) + obese,   Peds negative pediatric ROS (+)  Hematology negative hematology ROS (+)   Anesthesia Other Findings Right 2nd/3rd/4th hammertoes, metatarsalgia   HLD  Reproductive/Obstetrics negative OB ROS                            Anesthesia Physical Anesthesia Plan  ASA: II  Anesthesia Plan: Regional and General   Post-op Pain Management: GA combined w/ Regional for post-op pain   Induction: Intravenous  PONV Risk Score and Plan: 2 and Propofol infusion, TIVA and Treatment may vary due to age or medical condition  Airway Management Planned: LMA  Additional Equipment: None  Intra-op Plan:   Post-operative Plan: Extubation in OR  Informed Consent: I have reviewed the patients History and Physical, chart, labs and discussed the procedure including the risks, benefits and alternatives for the proposed anesthesia with the patient or authorized representative who has indicated his/her understanding and acceptance.     Dental advisory given  Plan Discussed with: CRNA  Anesthesia Plan Comments:         Anesthesia Quick Evaluation

## 2019-08-01 NOTE — Discharge Instructions (Addendum)
John Hewitt, MD [facility]  Please read the following information regarding your care after surgery.  Medications  You only need a prescription for the narcotic pain medicine (ex. oxycodone, Percocet, Norco).  All of the other medicines listed below are available over the counter. X Aleve 2 pills twice a day for the first 3 days after surgery. X acetominophen (Tylenol) 650 mg every 4-6 hours as you need for minor to moderate pain X oxycodone as prescribed for severe pain  Narcotic pain medicine (ex. oxycodone, Percocet, Vicodin) will cause constipation.  To prevent this problem, take the following medicines while you are taking any pain medicine. X docusate sodium (Colace) 100 mg twice a day X senna (Senokot) 2 tablets twice a day  Weight Bearing X Bear weight only on your operated foot in the post-op shoe.   Cast / Splint / Dressing X Keep your splint, cast or dressing clean and dry.  Don't put anything (coat hanger, pencil, etc) down inside of it.  If it gets damp, use a hair dryer on the cool setting to dry it.  If it gets soaked, call the office to schedule an appointment for a cast change.   After your dressing, cast or splint is removed; you may shower, but do not soak or scrub the wound.  Allow the water to run over it, and then gently pat it dry.  Swelling It is normal for you to have swelling where you had surgery.  To reduce swelling and pain, keep your toes above your nose for at least 3 days after surgery.  It may be necessary to keep your foot or leg elevated for several weeks.  If it hurts, it should be elevated.  Follow Up Call my office at [phone] when you are discharged from the hospital or surgery center to schedule an appointment to be seen two weeks after surgery.  Call my office at [phone] if you develop a fever >101.5 F, nausea, vomiting, bleeding from the surgical site or severe pain.     Post Anesthesia Home Care Instructions  Activity: Get  plenty of rest for the remainder of the day. A responsible individual must stay with you for 24 hours following the procedure.  For the next 24 hours, DO NOT: -Drive a car -Operate machinery -Drink alcoholic beverages -Take any medication unless instructed by your physician -Make any legal decisions or sign important papers.  Meals: Start with liquid foods such as gelatin or soup. Progress to regular foods as tolerated. Avoid greasy, spicy, heavy foods. If nausea and/or vomiting occur, drink only clear liquids until the nausea and/or vomiting subsides. Call your physician if vomiting continues.  Special Instructions/Symptoms: Your throat may feel dry or sore from the anesthesia or the breathing tube placed in your throat during surgery. If this causes discomfort, gargle with warm salt water. The discomfort should disappear within 24 hours.  If you had a scopolamine patch placed behind your ear for the management of post- operative nausea and/or vomiting:  1. The medication in the patch is effective for 72 hours, after which it should be removed.  Wrap patch in a tissue and discard in the trash. Wash hands thoroughly with soap and water. 2. You may remove the patch earlier than 72 hours if you experience unpleasant side effects which may include dry mouth, dizziness or visual disturbances. 3. Avoid touching the patch. Wash your hands with soap and water after contact with the patch.  Regional Anesthesia Blocks  1. Numbness or   the inability to move the "blocked" extremity may last from 3-48 hours after placement. The length of time depends on the medication injected and your individual response to the medication. If the numbness is not going away after 48 hours, call your surgeon.  2. The extremity that is blocked will need to be protected until the numbness is gone and the  Strength has returned. Because you cannot feel it, you will need to take extra care to avoid injury. Because it may be  weak, you may have difficulty moving it or using it. You may not know what position it is in without looking at it while the block is in effect.  3. For blocks in the legs and feet, returning to weight bearing and walking needs to be done carefully. You will need to wait until the numbness is entirely gone and the strength has returned. You should be able to move your leg and foot normally before you try and bear weight or walk. You will need someone to be with you when you first try to ensure you do not fall and possibly risk injury.  4. Bruising and tenderness at the needle site are common side effects and will resolve in a few days.  5. Persistent numbness or new problems with movement should be communicated to the surgeon or the [facility] ([phone])/ [facility] ([phone]).        

## 2019-08-01 NOTE — Anesthesia Procedure Notes (Signed)
Procedure Name: LMA Insertion Date/Time: 08/01/2019 9:27 AM Performed by: Signe Colt, CRNA Pre-anesthesia Checklist: Patient identified, Emergency Drugs available, Suction available and Patient being monitored Patient Re-evaluated:Patient Re-evaluated prior to induction Oxygen Delivery Method: Circle system utilized Preoxygenation: Pre-oxygenation with 100% oxygen Induction Type: IV induction Ventilation: Mask ventilation without difficulty LMA: LMA inserted LMA Size: 4.0 Number of attempts: 1 Airway Equipment and Method: Bite block Placement Confirmation: positive ETCO2 Tube secured with: Tape Dental Injury: Teeth and Oropharynx as per pre-operative assessment

## 2019-08-01 NOTE — Anesthesia Postprocedure Evaluation (Signed)
Anesthesia Post Note  Patient: Tajanae Sare  Procedure(s) Performed: Right Second, Third, and Fourth Metatarsal Weil Osteotomies and Third/Fourth Hammertoe Corrections (Right Foot)     Patient location during evaluation: PACU Anesthesia Type: Regional and General Level of consciousness: awake and alert, oriented and patient cooperative Pain management: pain level controlled Vital Signs Assessment: post-procedure vital signs reviewed and stable Respiratory status: spontaneous breathing, nonlabored ventilation and respiratory function stable Cardiovascular status: blood pressure returned to baseline and stable Postop Assessment: no apparent nausea or vomiting Anesthetic complications: no    Last Vitals:  Vitals:   08/01/19 1052 08/01/19 1110  BP:  129/66  Pulse: 89 92  Resp: 15 16  Temp:  36.5 C  SpO2: 99% 98%    Last Pain:  Vitals:   08/01/19 1052  TempSrc:   PainSc: 0-No pain                 Pervis Hocking

## 2019-08-01 NOTE — Progress Notes (Signed)
Assisted Dr. Doroteo Glassman with right, ultrasound guided, popliteal, adductor canal block. Side rails up, monitors on throughout procedure. See vital signs in flow sheet. Tolerated Procedure well.

## 2019-08-01 NOTE — Op Note (Signed)
08/01/2019  10:24 AM  PATIENT:  Hannah Charles  73 y.o. female  PRE-OPERATIVE DIAGNOSIS: 1.  Right forefoot metatarsalgia at the second, third and fourth metatarsal heads 2.  Right third and fourth hammertoe deformities  POST-OPERATIVE DIAGNOSIS: Same  Procedure(s): 1.  Right Second, Third, and Fourth Metatarsal Weil Osteotomies through separate incisions 2.  Right third and fourth Hammertoe Corrections through separate incisions 3.  Right foot AP, lateral and oblique radiographs  SURGEON:  Wylene Simmer, MD  ASSISTANT: Mechele Claude, PA-C  ANESTHESIA:   General, regional  EBL:  minimal   TOURNIQUET:   Total Tourniquet Time Documented: Thigh (Right) - 33 minutes Total: Thigh (Right) - 33 minutes  COMPLICATIONS:  None apparent  DISPOSITION:  Extubated, awake and stable to recovery.  INDICATION FOR PROCEDURE: The patient is a 73 year old female with a long history of right forefoot pain.  She has had previous podiatric treatment involving fifth metatarsal head excision and second hammertoe correction.  She has significant transfer metatarsalgia at the central metatarsals.  She also has third and fourth hammertoe deformities.  She presents now for operative treatment of these painful conditions having failed nonoperative treatment to date.  The risks and benefits of the alternative treatment options have been discussed in detail.  The patient wishes to proceed with surgery and specifically understands risks of bleeding, infection, nerve damage, blood clots, need for additional surgery, amputation and death.  PROCEDURE IN DETAIL:  After pre operative consent was obtained, and the correct operative site was identified, the patient was brought to the operating room and placed supine on the OR table.  Anesthesia was administered.  Pre-operative antibiotics were administered.  A surgical timeout was taken.  The right lower extremity was prepped and draped in standard sterile fashion with a  tourniquet around the thigh.  The extremity was elevated and the tourniquet was inflated to 250 mmHg.  A longitudinal incision was made at the second webspace.  Dissection was carried down to the subcutaneous tissues.  The extensor tendons were protected.  The dorsal joint capsule was incised exposing the second metatarsal head.  A Weil osteotomy was made with the oscillating saw allowing the metatarsal head to retract several millimeters proximally.  The osteotomy was fixed with a 2 mm Biomet FRS screw.  Overhanging bone was trimmed with a rondure.  The third metatarsal head was exposed and shortened in the same fashion again with a 2 mm Biomet FRS screw.  Attention was turned to the fourth MTP joint where a longitudinal incision was made.  Dissection was carried down through the subcutaneous tissues.  The extensor tendons were protected and the dorsal joint capsule was incised.  A Weil osteotomy was made with the oscillating saw.  The metatarsal head was allowed to retract proximally and the osteotomy was fixed with a 2 mm Biomet FRS screw.  Overhanging bone was trimmed with a rondure.  Radiographs confirmed appropriate shortening of the second, third and fourth metatarsals and appropriate position and length of the screws.  The third toe was then opened with a transverse incision over the PIP joint.  Dissection was carried down through the subcutaneous tissues and extensor mechanism.  The head of the proximal phalanx was resected followed by the base of middle phalanx.  The joint was reduced and fixed with a 2.5 mm Biomet VPC screw.  Same procedure was then performed for the fourth toe through separate incision.  Final AP and lateral radiographs confirmed appropriate position and length of all  hardware and appropriate correction of the right forefoot deformities.  No other acute injuries are noted.  Wounds were irrigated copiously.  Skin incisions were closed with horizontal mattress sutures of 3-0 nylon.   Sterile dressings were applied followed by a compression wrap and a postop shoe.  Tourniquet was released after application of the dressings.  The patient was awakened from anesthesia and transported to the recovery room in stable condition.  FOLLOW UP PLAN: Weightbearing as tolerated on the heel in a Darco shoe.  Follow-up in the office in 2 weeks for suture removal.  Plan weightbearing immobilization for 6 weeks postop.  No DVT prophylaxis is indicated in this ambulatory patient.  RADIOGRAPHS: AP and lateral radiographs of the right forefoot are obtained intraoperatively.  These show interval shortening of the second, third and fourth metatarsals and interval arthrodesis of the third and fourth toe PIP joints.  Hardware is appropriately positioned and of the appropriate lengths.  No other acute injuries are noted.    Mechele Claude PA-C was present and scrubbed for the duration of the operative case. His assistance was essential in positioning the patient, prepping and draping, gaining and maintaining exposure, performing the operation, closing and dressing the wounds and applying the splint.

## 2019-08-01 NOTE — H&P (Signed)
Hannah Charles is an 73 y.o. female.   Chief Complaint:  Right foot pain HPI:    The patient is a 73 year old female with a long history of right forefoot pain.  She has metatarsalgia at the second, third and fourth metatarsal heads.  She has third and fourth hammertoe deformities.  She presents now for operative treatment of these painful forefoot deformities having failed nonoperative treatment to date.  Past Medical History:  Diagnosis Date  . Asthma    currently ruling out asthma    Past Surgical History:  Procedure Laterality Date  . APPENDECTOMY    . BACK SURGERY     X 2  . FOOT SURGERY     X 3  . FRACTURE SURGERY     Right tibia  . KNEE SURGERY Left 2007  . SEPTOPLASTY  1998    Family History  Problem Relation Age of Onset  . Breast cancer Sister    Social History:  reports that she has never smoked. She has never used smokeless tobacco. She reports current alcohol use. She reports that she does not use drugs.  Allergies:  Allergies  Allergen Reactions  . Compazine [Prochlorperazine Edisylate] Other (See Comments)    Angioedema & Seizures  . Penicillins Other (See Comments)    Medications Prior to Admission  Medication Sig Dispense Refill  . aspirin EC 81 MG tablet Take 81 mg by mouth daily.    Marland Kitchen atorvastatin (LIPITOR) 40 MG tablet Take 40 mg by mouth daily.    . Azelastine HCl 137 MCG/SPRAY SOLN USE 2 SPRAYS INTRANASALLY 2 TIMES DAILY in EACH NOSTRIL  11  . budesonide-formoterol (SYMBICORT) 160-4.5 MCG/ACT inhaler Inhale 2 puffs into the lungs 2 (two) times daily.    Marland Kitchen escitalopram (LEXAPRO) 20 MG tablet Take 20 mg by mouth daily.  3  . levocetirizine (XYZAL) 5 MG tablet Take 5 mg by mouth every evening.    . montelukast (SINGULAIR) 10 MG tablet Take 10 mg by mouth daily.  3  . Multiple Vitamin (MULTIVITAMIN) capsule Take 1 capsule by mouth daily.    Marland Kitchen triamcinolone (NASACORT ALLERGY 24HR) 55 MCG/ACT AERO nasal inhaler Place 2 sprays into the nose daily.     . Vitamin D, Ergocalciferol, (DRISDOL) 1.25 MG (50000 UT) CAPS capsule Take 50,000 Units by mouth every 7 (seven) days.    Marland Kitchen zinc gluconate 50 MG tablet Take 50 mg by mouth daily.    . benzonatate (TESSALON) 200 MG capsule TAKE 1 CAPSULE BY MOUTH 3 TIMES DAILY when needed FOR coughing  0    No results found for this or any previous visit (from the past 48 hour(s)). No results found.  ROS no recent fever, chills, nausea, vomiting or changes in her appetite  Blood pressure 131/73, pulse 83, temperature (!) 97.5 F (36.4 C), temperature source Oral, resp. rate 13, height 5\' 4"  (1.626 m), weight 89.7 kg, SpO2 100 %. Physical Exam  Well-nourished well-developed elderly woman in no apparent distress.  Alert and oriented x4.  Mood and affect are normal.  Extraocular motions are intact.  Respirations are unlabored.  Gait is normal.  Right forefoot is tender to palpation beneath the metatarsal heads.  Skin is healthy and intact.  Pulses are palpable.  No lymphadenopathy.  Sensibility to light touch is intact plantarly and dorsally at the forefoot.  5 out of 5 strength in plantarflexion and dorsiflexion of the ankle and toes.   Assessment/Plan Right forefoot metatarsalgia and third and fourth hammertoe deformities -  to the operating room today for surgical treatment.  The risks and benefits of the alternative treatment options have been discussed in detail.  The patient wishes to proceed with surgery and specifically understands risks of bleeding, infection, nerve damage, blood clots, need for additional surgery, amputation and death.   Wylene Simmer, MD 30-Aug-2019, 9:09 AM

## 2019-08-01 NOTE — Transfer of Care (Signed)
Immediate Anesthesia Transfer of Care Note  Patient: Hannah Charles  Procedure(s) Performed: Right Second, Third, and Fourth Metatarsal Weil Osteotomies and Third/Fourth Hammertoe Corrections (Right Foot)  Patient Location: PACU  Anesthesia Type:GA combined with regional for post-op pain  Level of Consciousness: drowsy and patient cooperative  Airway & Oxygen Therapy: Patient Spontanous Breathing and Patient connected to face mask oxygen  Post-op Assessment: Report given to RN and Post -op Vital signs reviewed and stable  Post vital signs: Reviewed and stable  Last Vitals:  Vitals Value Taken Time  BP 126/59 08/01/19 1021  Temp    Pulse 96 08/01/19 1023  Resp 14 08/01/19 1023  SpO2 99 % 08/01/19 1023  Vitals shown include unvalidated device data.  Last Pain:  Vitals:   08/01/19 0755  TempSrc: Oral  PainSc: 2       Patients Stated Pain Goal: 5 ([id number])  Complications: No apparent anesthesia complications

## 2019-08-01 NOTE — Anesthesia Procedure Notes (Addendum)
Anesthesia Regional Block: Adductor canal block   Pre-Anesthetic Checklist: ,, timeout performed, Correct Patient, Correct Site, Correct Laterality, Correct Procedure, Correct Position, site marked, Risks and benefits discussed,  Surgical consent,  Pre-op evaluation,  At surgeon's request and post-op pain management  Laterality: Left  Prep: Maximum Sterile Barrier Precautions used, chloraprep       Needles:  Injection technique: Single-shot  Needle Type: Echogenic Stimulator Needle     Needle Length: 9cm  Needle Gauge: 22     Additional Needles:   Procedures:,,,, ultrasound used (permanent image in chart),,,,  Narrative:  Start time: 08/01/2019 9:00 AM End time: 08/01/2019 9:05 AM Injection made incrementally with aspirations every 5 mL.  Performed by: Personally  Anesthesiologist: Pervis Hocking, DO  Additional Notes: Monitors applied. No increased pain on injection. No increased resistance to injection. Injection made in 5cc increments. Good needle visualization. Patient tolerated procedure well.

## 2019-08-05 ENCOUNTER — Encounter (HOSPITAL_BASED_OUTPATIENT_CLINIC_OR_DEPARTMENT_OTHER): Payer: Self-pay | Admitting: Orthopedic Surgery

## 2019-09-20 DIAGNOSIS — L565 Disseminated superficial actinic porokeratosis (DSAP): Secondary | ICD-10-CM | POA: Diagnosis not present

## 2019-09-20 DIAGNOSIS — L570 Actinic keratosis: Secondary | ICD-10-CM | POA: Diagnosis not present

## 2019-11-08 ENCOUNTER — Ambulatory Visit: Payer: Medicare Other | Attending: Internal Medicine

## 2019-11-08 DIAGNOSIS — Z23 Encounter for immunization: Secondary | ICD-10-CM | POA: Insufficient documentation

## 2019-11-08 NOTE — Progress Notes (Signed)
   Covid-19 Vaccination Clinic  Name:  Hannah Charles    MRN: [id number] DOB: 03-15-1946  11/08/2019  Hannah Charles was observed post Covid-19 immunization for 15 minutes without incidence. She was provided with Vaccine Information Sheet and instruction to access the V-Safe system.   Hannah Charles was instructed to call 911 with any severe reactions post vaccine: Marland Kitchen Difficulty breathing  . Swelling of your face and throat  . A fast heartbeat  . A bad rash all over your body  . Dizziness and weakness    Immunizations Administered    Name Date Dose VIS Date Route   Pfizer COVID-19 Vaccine 11/08/2019 12:37 PM 0.3 mL 08/23/2019 Intramuscular   Manufacturer: [place]   Lot: [id number]   [place]: [id number]

## 2019-12-03 ENCOUNTER — Ambulatory Visit: Payer: Medicare Other | Attending: Internal Medicine

## 2019-12-03 DIAGNOSIS — Z23 Encounter for immunization: Secondary | ICD-10-CM

## 2019-12-03 NOTE — Progress Notes (Signed)
   Covid-19 Vaccination Clinic  Name:  Hannah Charles    MRN: [id number] DOB: [birth year]  12/03/2019  [name] was observed post Covid-19 immunization for 15 minutes without incident. She was provided with Vaccine Information Sheet and instruction to access the V-Safe system.   [name] was instructed to call 911 with any severe reactions post vaccine: Marland Kitchen Difficulty breathing  . Swelling of face and throat  . A fast heartbeat  . A bad rash all over body  . Dizziness and weakness   Immunizations Administered    Name Date Dose VIS Date Route   Pfizer COVID-19 Vaccine 12/03/2019  3:26 PM 0.3 mL 08/23/2019 Intramuscular   Manufacturer: [place]   Lot: [id number]   [place]: [id number]

## 2019-12-04 DIAGNOSIS — J3089 Other allergic rhinitis: Secondary | ICD-10-CM | POA: Diagnosis not present

## 2019-12-04 DIAGNOSIS — J342 Deviated nasal septum: Secondary | ICD-10-CM | POA: Diagnosis not present

## 2019-12-04 DIAGNOSIS — J3081 Allergic rhinitis due to animal (cat) (dog) hair and dander: Secondary | ICD-10-CM | POA: Diagnosis not present

## 2019-12-04 DIAGNOSIS — R05 Cough: Secondary | ICD-10-CM | POA: Diagnosis not present

## 2020-02-03 ENCOUNTER — Other Ambulatory Visit: Payer: Self-pay | Admitting: Family Medicine

## 2020-02-03 DIAGNOSIS — Z1231 Encounter for screening mammogram for malignant neoplasm of breast: Secondary | ICD-10-CM

## 2020-02-06 ENCOUNTER — Ambulatory Visit
Admission: RE | Admit: 2020-02-06 | Discharge: 2020-02-06 | Disposition: A | Discharge status: Home / Self Care | Payer: Medicare Other | Source: Ambulatory Visit | Attending: Family Medicine | Admitting: Family Medicine

## 2020-02-06 DIAGNOSIS — Z1231 Encounter for screening mammogram for malignant neoplasm of breast: Secondary | ICD-10-CM | POA: Diagnosis not present

## 2020-07-27 DIAGNOSIS — J3089 Other allergic rhinitis: Secondary | ICD-10-CM | POA: Diagnosis not present

## 2020-07-27 DIAGNOSIS — J3081 Allergic rhinitis due to animal (cat) (dog) hair and dander: Secondary | ICD-10-CM | POA: Diagnosis not present

## 2020-08-12 ENCOUNTER — Other Ambulatory Visit: Payer: Self-pay | Admitting: Family

## 2020-08-12 DIAGNOSIS — Z13820 Encounter for screening for osteoporosis: Secondary | ICD-10-CM

## 2020-08-27 ENCOUNTER — Ambulatory Visit
Admission: RE | Admit: 2020-08-27 | Discharge: 2020-08-27 | Disposition: A | Discharge status: Home / Self Care | Payer: Medicare Other | Source: Ambulatory Visit | Attending: Allergy and Immunology | Admitting: Allergy and Immunology

## 2020-08-27 ENCOUNTER — Other Ambulatory Visit: Payer: Self-pay | Admitting: Allergy and Immunology

## 2020-08-27 ENCOUNTER — Other Ambulatory Visit: Payer: Self-pay

## 2020-08-27 DIAGNOSIS — M1712 Unilateral primary osteoarthritis, left knee: Secondary | ICD-10-CM | POA: Diagnosis not present

## 2020-08-27 DIAGNOSIS — M25551 Pain in right hip: Secondary | ICD-10-CM

## 2020-08-27 DIAGNOSIS — M25562 Pain in left knee: Secondary | ICD-10-CM

## 2020-08-27 DIAGNOSIS — R059 Cough, unspecified: Secondary | ICD-10-CM | POA: Diagnosis not present

## 2020-08-27 DIAGNOSIS — M1611 Unilateral primary osteoarthritis, right hip: Secondary | ICD-10-CM | POA: Diagnosis not present

## 2020-08-27 DIAGNOSIS — R0989 Other specified symptoms and signs involving the circulatory and respiratory systems: Secondary | ICD-10-CM

## 2020-08-27 DIAGNOSIS — M778 Other enthesopathies, not elsewhere classified: Secondary | ICD-10-CM | POA: Diagnosis not present

## 2020-08-27 DIAGNOSIS — M533 Sacrococcygeal disorders, not elsewhere classified: Secondary | ICD-10-CM | POA: Diagnosis not present

## 2020-08-27 DIAGNOSIS — R0602 Shortness of breath: Secondary | ICD-10-CM | POA: Diagnosis not present

## 2020-11-17 ENCOUNTER — Ambulatory Visit (INDEPENDENT_AMBULATORY_CARE_PROVIDER_SITE_OTHER): Payer: Medicare Other | Admitting: Podiatry

## 2020-11-17 ENCOUNTER — Encounter: Payer: Self-pay | Admitting: Podiatry

## 2020-11-17 ENCOUNTER — Other Ambulatory Visit: Payer: Self-pay

## 2020-11-17 DIAGNOSIS — L603 Nail dystrophy: Secondary | ICD-10-CM

## 2020-11-17 DIAGNOSIS — L600 Ingrowing nail: Secondary | ICD-10-CM

## 2020-11-17 DIAGNOSIS — J309 Allergic rhinitis, unspecified: Secondary | ICD-10-CM | POA: Insufficient documentation

## 2020-11-17 DIAGNOSIS — J3081 Allergic rhinitis due to animal (cat) (dog) hair and dander: Secondary | ICD-10-CM | POA: Insufficient documentation

## 2020-11-17 MED ORDER — NEOMYCIN-POLYMYXIN-HC 1 % OT SOLN: OTIC | 1 refills | Status: DC

## 2020-11-17 NOTE — Patient Instructions (Signed)

## 2020-11-17 NOTE — Progress Notes (Signed)
Subjective:  Patient ID: Hannah Charles, female    DOB: 1946/08/29,  MRN: [id number] HPI Chief Complaint  Patient presents with  . Toe Pain    Hallux left - medial border, tender x months  . Nail Problem    Hallux right - toenail is thick and dark  . New Patient (Initial Visit)    75 y.o. female presents with the above complaint.   ROS: Denies fever chills nausea vomiting muscle aches pains calf pain back pain chest pain shortness of breath.  Past Medical History:  Diagnosis Date  . Asthma    currently ruling out asthma   Past Surgical History:  Procedure Laterality Date  . APPENDECTOMY    . BACK SURGERY     X 2  . FOOT SURGERY     X 3  . FRACTURE SURGERY     Right tibia  . HAMMERTOE RECONSTRUCTION WITH WEIL OSTEOTOMY Right 08/01/2019   Procedure: Right Second, Third, and Fourth Metatarsal Weil Osteotomies and Third/Fourth Hammertoe Corrections;  Surgeon: Wylene Simmer, MD;  Location: [facility];  Service: Orthopedics;  Laterality: Right;  . KNEE SURGERY Left 2007  . SEPTOPLASTY  1998    Current Outpatient Medications:  .  DULoxetine (CYMBALTA) 30 MG capsule, Take 1 tablet by mouth daily., Disp: , Rfl:  .  NEOMYCIN-POLYMYXIN-HYDROCORTISONE (CORTISPORIN) 1 % SOLN OTIC solution, Apply 1-2 drops to toe BID after soaking, Disp: 10 mL, Rfl: 1 .  albuterol (VENTOLIN HFA) 108 (90 Base) MCG/ACT inhaler, 2 puffs as needed, Disp: , Rfl:  .  atorvastatin (LIPITOR) 40 MG tablet, Take 40 mg by mouth daily., Disp: , Rfl:  .  Azelastine HCl 137 MCG/SPRAY SOLN, USE 2 SPRAYS INTRANASALLY 2 TIMES DAILY in EACH NOSTRIL, Disp: , Rfl: 11 .  EPINEPHrine 0.3 mg/0.3 mL IJ SOAJ injection, Inject into the muscle as needed., Disp: , Rfl:  .  famotidine (PEPCID) 20 MG tablet, Take 20 mg by mouth daily., Disp: , Rfl:  .  levocetirizine (XYZAL) 5 MG tablet, Take 5 mg by mouth every evening., Disp: , Rfl:   Allergies  Allergen Reactions  . Compazine [Prochlorperazine Edisylate]  Other (See Comments)    Angioedema & Seizures  . Penicillins Other (See Comments)  . Cat Hair Extract   . Dog Epithelium Allergy Skin Test   . Molds & Smuts    Review of Systems Objective:  There were no vitals filed for this visit.  General: Well developed, nourished, in no acute distress, alert and oriented x3   Dermatological: Skin is warm, dry and supple bilateral. Nails x 10 are well maintained; remaining integument appears unremarkable at this time. There are no open sores, no preulcerative lesions, no rash or signs of infection present.  Ingrown toenail tibial border hallux left.  Nail dystrophy hallux right.  Vascular: Dorsalis Pedis artery and Posterior Tibial artery pedal pulses are 2/4 bilateral with immedate capillary fill time. Pedal hair growth present. No varicosities and no lower extremity edema present bilateral.   Neruologic: Grossly intact via light touch bilateral. Vibratory intact via tuning fork bilateral. Protective threshold with Semmes Wienstein monofilament intact to all pedal sites bilateral. Patellar and Achilles deep tendon reflexes 2+ bilateral. No Babinski or clonus noted bilateral.   Musculoskeletal: No gross boney pedal deformities bilateral. No pain, crepitus, or limitation noted with foot and ankle range of motion bilateral. Muscular strength 5/5 in all groups tested bilateral.  Gait: Unassisted, Nonantalgic.    Radiographs:  None taken  Assessment &  Plan:   Assessment: Ingrown toenail medial border hallux left.  Nail dystrophy hallux right  Plan: Explained to her that most likely the hallux nail right skull to fall off due to the dystrophy.  Left foot does demonstrate ingrown nail tibial border hallux left.  We went ahead and performed a chemical matricectomy to this toe today she tolerated procedure well after local anesthetic was administered.  She was provided with both oral and written home-going instruction for the care and soaking of the toe  will follow up with me in 2 weeks.  He is also provided a prescription for Cortisporin Otic to be applied twice daily after soaking.  She will notify us with questions or concerns.     Max T. Leslie, [place]

## 2020-12-08 ENCOUNTER — Ambulatory Visit (INDEPENDENT_AMBULATORY_CARE_PROVIDER_SITE_OTHER): Payer: Medicare Other | Admitting: Podiatry

## 2020-12-08 ENCOUNTER — Other Ambulatory Visit: Payer: Self-pay

## 2020-12-08 ENCOUNTER — Encounter: Payer: Self-pay | Admitting: Podiatry

## 2020-12-08 DIAGNOSIS — L600 Ingrowing nail: Secondary | ICD-10-CM

## 2020-12-08 DIAGNOSIS — Z9889 Other specified postprocedural states: Secondary | ICD-10-CM

## 2020-12-08 NOTE — Progress Notes (Signed)
She presents today for follow-up of her nail procedure hallux left states that is doing good.  States that she has discontinued soaking.  Objective: Vital signs are stable alert oriented x3 there is no erythema edema cellulitis drainage odor.  Assessment: Healing surgical toe left.  Plan: Continue to soak every other day if necessary.

## 2021-03-22 ENCOUNTER — Other Ambulatory Visit: Payer: Self-pay | Admitting: Family Medicine

## 2021-03-22 DIAGNOSIS — Z1231 Encounter for screening mammogram for malignant neoplasm of breast: Secondary | ICD-10-CM

## 2021-05-14 ENCOUNTER — Ambulatory Visit
Admission: RE | Admit: 2021-05-14 | Discharge: 2021-05-14 | Disposition: A | Discharge status: Home / Self Care | Payer: Medicare Other | Source: Ambulatory Visit | Attending: Family Medicine | Admitting: Family Medicine

## 2021-05-14 ENCOUNTER — Other Ambulatory Visit: Payer: Self-pay

## 2021-05-14 DIAGNOSIS — Z1231 Encounter for screening mammogram for malignant neoplasm of breast: Secondary | ICD-10-CM

## 2021-12-21 NOTE — Patient Instructions (Signed)
DUE TO COVID-19 ONLY TWO VISITOR  (aged 76 and older)  IS ALLOWED TO COME WITH YOU AND STAY IN THE WAITING ROOM ONLY DURING PRE OP AND PROCEDURE.   ?**NO VISITORS ARE ALLOWED IN THE SHORT STAY AREA OR RECOVERY ROOM!!** ? ?IF YOU WILL BE ADMITTED INTO THE HOSPITAL YOU ARE ALLOWED ONLY FOUR  SUPPORT PEOPLE DURING VISITATION HOURS ONLY (7 AM -8PM)   ?The support person(s) must pass our screening, gel in and out, and wear a mask at all times, including in the patient?s room. ?Patients must also wear a mask when staff or their support person are in the room. ?Visitors GUEST BADGE MUST BE WORN VISIBLY  ?One adult visitor may remain with you overnight and MUST be in the room by 8 P.M. ?  ? ? Your procedure is scheduled on: 12-30-21 ? ? Report to [facility] Main Entrance ? ?  Report to admitting at     [place]  AM ? ? Call this number if you have problems the morning of surgery [phone] ? ? Do not eat food :After Midnight. ? ? After Midnight you may have the following liquids until _0415 _____ AM DAY OF SURGERY  Then nothing by mouth ? ?Water ?Black Coffee (sugar ok, NO MILK/CREAM OR CREAMERS)  ?Tea (sugar ok, NO MILK/CREAM OR CREAMERS) regular and decaf                             ?Plain Jell-O (NO RED)                                           ?Fruit ices (not with fruit pulp, NO RED)                                     ?Popsicles (NO RED)                                                                  ?Juice: apple, WHITE grape, WHITE cranberry ?Sports drinks like Gatorade (NO RED) ?Clear broth(vegetable,chicken,beef) ? ?             ?  ? ?  ?  ?The day of surgery:  ?Drink ONE (1) Pre-Surgery Clear Ensure or G2 at AM the morning of surgery. Drink in one sitting. Do not sip.  ?This drink was given to you during your hospital  ?pre-op appointment visit. ?Nothing else to drink after completing the  ?Pre-Surgery Clear Ensure or G2. ?  ?       If you have questions, please contact your surgeon?s office. ? ? ?FOLLOW  ANY ADDITIONAL PRE OP INSTRUCTIONS YOU RECEIVED FROM YOUR SURGEON'S OFFICE!!! ?  ?  ?Oral Hygiene is also important to reduce your risk of infection.                                    ?Remember - BRUSH YOUR TEETH THE MORNING OF SURGERY WITH YOUR  REGULAR TOOTHPASTE  No Gum candy or mints ? ? Do NOT smoke after Midnight ? ? Take these medicines the morning of surgery with A SIP OF WATER: Duloxetine, atorvastatin, Nasal sprays ok to use ? ?          ?            You may not have any metal on your body including hair pins, jewelry, and body piercing ? ?           Do not wear make-up, lotions, powders, perfumes/cologne, or deodorant ? ?Do not wear nail polish including gel and S&S, artificial/acrylic nails, or any other type of covering on natural nails including finger and toenails. If you have artificial nails, gel coating, etc. that needs to be removed by a nail salon please have this removed prior to surgery or surgery may need to be canceled/ delayed if the surgeon/ anesthesia feels like they are unable to be safely monitored.  ? ?Do not shave  48 hours prior to surgery.  ? ?   ? ? Do not bring valuables to the hospital. [place] NOT ?            RESPONSIBLE   FOR VALUABLES. ? ? Contacts, dentures or bridgework may not be worn into surgery. ? ? Bring small overnight bag day of surgery. ?  ? Patients discharged on the day of surgery will not be allowed to drive home.  Someone NEEDS to stay with you for the first 24 hours after anesthesia. ? ? Special Instructions: Bring a copy of your healthcare power of attorney and living will documents         the day of surgery if you haven't scanned them before. ? ?            Please read over the following fact sheets you were given: IF [place] [phone] ? ?   [place] - Preparing for Surgery ?Before surgery, you can play an important role.  Because skin is not sterile, your skin needs to be as free of germs as  possible.  You can reduce the number of germs on your skin by washing with CHG (chlorahexidine gluconate) soap before surgery.  CHG is an antiseptic cleaner which kills germs and bonds with the skin to continue killing germs even after washing. ?Please DO NOT use if you have an allergy to CHG or antibacterial soaps.  If your skin becomes reddened/irritated stop using the CHG and inform your nurse when you arrive at Short Stay. ?Do not shave (including legs and underarms) for at least 48 hours prior to the first CHG shower.  You may shave your face/neck. ?Please follow these instructions carefully: ? 1.  Shower with CHG Soap the night before surgery and the  morning of Surgery. ? 2.  If you choose to wash your hair, wash your hair first as usual with your  normal  shampoo. ? 3.  After you shampoo, rinse your hair and body thoroughly to remove the  shampoo.                           4.  Use CHG as you would any other liquid soap.  You can apply chg directly  to the skin and wash  ?  Gently with a scrungie or clean washcloth. ? 5.  Apply the CHG Soap to your body ONLY FROM THE NECK DOWN.   Do not use on face/ open      ?                     Wound or open sores. Avoid contact with eyes, ears mouth and genitals (private parts).  ?                     Production manager,  Genitals (private parts) with your normal soap. ?            6.  Wash thoroughly, paying special attention to the area where your surgery  will be performed. ? 7.  Thoroughly rinse your body with warm water from the neck down. ? 8.  DO NOT shower/wash with your normal soap after using and rinsing off  the CHG Soap. ?               9.  Pat yourself dry with a clean towel. ?           10.  Wear clean pajamas. ?           11.  Place clean sheets on your bed the night of your first shower and do not  sleep with pets. ?Day of Surgery : ?Do not apply any lotions/deodorants the morning of surgery.  Please wear clean clothes to the hospital/surgery  center. ? ?FAILURE TO FOLLOW THESE INSTRUCTIONS MAY RESULT IN THE CANCELLATION OF YOUR SURGERY ?PATIENT SIGNATURE_________________________________ ? ?NURSE SIGNATURE__________________________________ ? ?________________________________________________________________________  ? ?Incentive Spirometer ? ?An incentive spirometer is a tool that can help keep your lungs clear and active. This tool measures how well you are filling your lungs with each breath. Taking long deep breaths may help reverse or decrease the chance of developing breathing (pulmonary) problems (especially infection) following: ?A long period of time when you are unable to move or be active. ?BEFORE THE PROCEDURE  ?If the spirometer includes an indicator to show your best effort, your nurse or respiratory therapist will set it to a desired goal. ?If possible, sit up straight or lean slightly forward. Try not to slouch. ?Hold the incentive spirometer in an upright position. ?INSTRUCTIONS FOR USE  ?Sit on the edge of your bed if possible, or sit up as far as you can in bed or on a chair. ?Hold the incentive spirometer in an upright position. ?Breathe out normally. ?Place the mouthpiece in your mouth and seal your lips tightly around it. ?Breathe in slowly and as deeply as possible, raising the piston or the ball toward the top of the column. ?Hold your breath for 3-5 seconds or for as long as possible. Allow the piston or ball to fall to the bottom of the column. ?Remove the mouthpiece from your mouth and breathe out normally. ?Rest for a few seconds and repeat Steps 1 through 7 at least 10 times every 1-2 hours when you are awake. Take your time and take a few normal breaths between deep breaths. ?The spirometer may include an indicator to show your best effort. Use the indicator as a goal to work toward during each repetition. ?After each set of 10 deep breaths, practice coughing to be sure your lungs are clear. If you have an incision (the cut  made at the time of surgery), support your incision when coughing by placing a pillow or rolled up towels  firmly against it. ?Once you are able to get out of bed, walk around indoors and cough well. You ma

## 2021-12-22 ENCOUNTER — Other Ambulatory Visit: Payer: Self-pay

## 2021-12-22 ENCOUNTER — Encounter (HOSPITAL_COMMUNITY)
Admission: RE | Admit: 2021-12-22 | Discharge: 2021-12-22 | Disposition: A | Discharge status: Home / Self Care | Payer: Medicare Other | Source: Ambulatory Visit | Attending: Orthopedic Surgery | Admitting: Orthopedic Surgery

## 2021-12-22 ENCOUNTER — Encounter (HOSPITAL_COMMUNITY): Payer: Self-pay

## 2021-12-22 VITALS — BP 157/68 | HR 84 | Temp 98.6°F | Resp 16 | Ht 64.0 in | Wt 176.0 lb

## 2021-12-22 DIAGNOSIS — M1712 Unilateral primary osteoarthritis, left knee: Secondary | ICD-10-CM | POA: Insufficient documentation

## 2021-12-22 DIAGNOSIS — Z01812 Encounter for preprocedural laboratory examination: Secondary | ICD-10-CM | POA: Diagnosis not present

## 2021-12-22 DIAGNOSIS — Z01818 Encounter for other preprocedural examination: Secondary | ICD-10-CM

## 2021-12-22 HISTORY — DX: Unspecified osteoarthritis, unspecified site: M19.90

## 2021-12-22 HISTORY — DX: Cardiac murmur, unspecified: R01.1

## 2021-12-22 HISTORY — DX: Depression, unspecified: F32.A

## 2021-12-22 HISTORY — DX: Pneumonia, unspecified organism: J18.9

## 2021-12-22 HISTORY — DX: Malignant (primary) neoplasm, unspecified: C80.1

## 2021-12-22 LAB — CBC
HCT: 42.6 % (ref 36.0–46.0)
Hemoglobin: 14.4 g/dL (ref 12.0–15.0)
MCH: 30.6 pg (ref 26.0–34.0)
MCHC: 33.8 g/dL (ref 30.0–36.0)
MCV: 90.6 fL (ref 80.0–100.0)
Platelets: 236 10*3/uL (ref 150–400)
RBC: 4.7 MIL/uL (ref 3.87–5.11)
RDW: 13.5 % (ref 11.5–15.5)
WBC: 5.8 10*3/uL (ref 4.0–10.5)
nRBC: 0 % (ref 0.0–0.2)

## 2021-12-22 LAB — COMPREHENSIVE METABOLIC PANEL
ALT: 23 U/L (ref 0–44)
AST: 26 U/L (ref 15–41)
Albumin: 3.9 g/dL (ref 3.5–5.0)
Alkaline Phosphatase: 69 U/L (ref 38–126)
Anion gap: 6 (ref 5–15)
BUN: 27 mg/dL — ABNORMAL HIGH (ref 8–23)
CO2: 25 mmol/L (ref 22–32)
Calcium: 8.9 mg/dL (ref 8.9–10.3)
Chloride: 107 mmol/L (ref 98–111)
Creatinine, Ser: 0.79 mg/dL (ref 0.44–1.00)
GFR, Estimated: >60 mL/min (ref >60)
Glucose, Bld: 99 mg/dL (ref 70–99)
Potassium: 4 mmol/L (ref 3.5–5.1)
Sodium: 138 mmol/L (ref 135–145)
Total Bilirubin: 0.5 mg/dL (ref 0.3–1.2)
Total Protein: 7 g/dL (ref 6.5–8.1)

## 2021-12-22 LAB — SURGICAL PCR SCREEN
MRSA, PCR: NEGATIVE
Staphylococcus aureus: NEGATIVE

## 2021-12-22 NOTE — Progress Notes (Addendum)
Anesthesia Review: ? ?PCP: [facility] [address]  ?Clearance on chart dated 11/05/21 by Margretta Sidle  ?Cardiologist : none  ?Chest x-ray : ?EKG : ?Echo : ?Stress test: ?Cardiac Cath :  ?Activity level: can do a flight of stairs without difficulty  ?Sleep Study/ CPAP : none  ?Fasting Blood Sugar :      / Checks Blood Sugar -- times a day:   ?Blood Thinner/ Instructions /Last Dose: ?ASA / Instructions/ Last Dose :   ?

## 2021-12-27 NOTE — Progress Notes (Signed)
CBC is a medically necessary test prior to undergoing joint replacement surgery. It is pertinent to know pre-operative hemoglobin for surgical risk assessment and planning.  ?

## 2021-12-29 NOTE — H&P (Signed)
TOTAL KNEE ADMISSION H&P ? ?Patient is being admitted for left total knee arthroplasty. ? ?Subjective: ? ?Chief Complaint:left knee pain. ? ?HPI: Hannah Charles, 76 y.o. female, has a history of pain and functional disability in the left knee due to arthritis and has failed non-surgical conservative treatments for greater than 12 weeks to includeNSAID's and/or analgesics, corticosteriod injections, and activity modification.  Onset of symptoms was gradual, starting 2 years ago with gradually worsening course since that time. The patient noted prior procedures on the knee to include  arthroscopy and menisectomy on the left knee(s).  Patient currently rates pain in the left knee(s) at 8 out of 10 with activity. Patient has worsening of pain with activity and weight bearing, pain that interferes with activities of daily living, and pain with passive range of motion.  Patient has evidence of joint space narrowing by imaging studies.There is no active infection. ? ?Patient Active Problem List  ? Diagnosis Date Noted  ? Allergic rhinitis 11/17/2020  ? Allergic rhinitis due to animal (cat) (dog) hair and dander 11/17/2020  ? Metatarsalgia of right foot 06/17/2019  ? Hammer toe 11/28/2018  ? ?Past Medical History:  ?Diagnosis Date  ? Arthritis   ? Asthma   ? currently ruling out asthma  ? Cancer [facility])   ? hx of skin cancer  ? Depression   ? Heart murmur   ? hx of heart murmur never followed by a cardiologist  ? Pneumonia   ?  ?Past Surgical History:  ?Procedure Laterality Date  ? APPENDECTOMY    ? BACK SURGERY    ? X 2  ? FOOT SURGERY    ? X 3  ? FRACTURE SURGERY    ? Right tibia  ? HAMMERTOE RECONSTRUCTION WITH WEIL OSTEOTOMY Right 08/01/2019  ? Procedure: Right Second, Third, and Fourth Metatarsal Weil Osteotomies and Third/Fourth Hammertoe Corrections;  Surgeon: Wylene Simmer, MD;  Location: [facility];  Service: Orthopedics;  Laterality: Right;  ? KNEE SURGERY Left 2007  ? SEPTOPLASTY  1998  ? stones  removed from salivary gland     ?  ?No current facility-administered medications for this encounter.  ? ?Current Outpatient Medications  ?Medication Sig Dispense Refill Last Dose  ? atorvastatin (LIPITOR) 40 MG tablet Take 40 mg by mouth in the morning.     ? Azelastine HCl 137 MCG/SPRAY SOLN USE 2 SPRAYS INTRANASALLY 2 TIMES DAILY in EACH NOSTRIL  11   ? Carboxymethylcellulose Sodium (STERILE LUBRICANT OP) Place 1 drop into both eyes at bedtime. iVIZIA     ? diphenhydramine-acetaminophen (TYLENOL PM EXTRA STRENGTH) 25-500 MG TABS tablet Take 1 tablet by mouth every evening.     ? DULoxetine (CYMBALTA) 30 MG capsule Take 30 mg by mouth in the morning.     ? EPINEPHrine 0.3 mg/0.3 mL IJ SOAJ injection Inject 0.3 mg into the muscle as needed for anaphylaxis.     ? famotidine (PEPCID) 20 MG tablet Take 20 mg by mouth at bedtime.     ? fexofenadine (ALLEGRA) 180 MG tablet Take 180 mg by mouth every evening.     ? fluticasone (FLONASE) 50 MCG/ACT nasal spray Place 1 spray into both nostrils daily.     ? gabapentin (NEURONTIN) 300 MG capsule Take 300 mg by mouth at bedtime.     ? montelukast (SINGULAIR) 10 MG tablet Take 10 mg by mouth at bedtime.     ? naproxen sodium (ALEVE) 220 MG tablet Take 220 mg by mouth in  the morning.     ? Polyethyl Glycol-Propyl Glycol (LUBRICANT EYE DROPS) 0.4-0.3 % SOLN Place 1-2 drops into both eyes 3 (three) times daily as needed (dry/irritated eyes.).     ? potassium chloride SA (KLOR-CON M) 20 MEQ tablet Take 20 mEq by mouth in the morning.     ? vitamin B-12 (CYANOCOBALAMIN) 1000 MCG tablet Take 1,000 mcg by mouth in the morning.     ? ?Allergies  ?Allergen Reactions  ? Compazine [Prochlorperazine Edisylate] Other (See Comments)  ?  Angioedema & Seizures  ? Penicillins Other (See Comments)  ?  Seizures Caffie Damme  ? Cat Hair Extract Other (See Comments)  ?  Chest/head congestion  ? Dog Epithelium Allergy Skin Test   ?  Chest/head congestion ?  ? Molds & Smuts   ?  Chest/head  congestion ?  ?  ?Social History  ? ?Tobacco Use  ? Smoking status: Former  ?  Types: Cigarettes  ? Smokeless tobacco: Never  ?Substance Use Topics  ? Alcohol use: Yes  ?  Comment: 1x per month  ?  ?Family History  ?Problem Relation Age of Onset  ? Breast cancer Sister   ?  ? ?Review of Systems  ?Constitutional:  Negative for chills and fever.  ?Respiratory:  Negative for cough and shortness of breath.   ?Cardiovascular:  Negative for chest pain.  ?Gastrointestinal:  Negative for nausea and vomiting.  ?Musculoskeletal:  Positive for arthralgias.  ? ? ?Objective: ? ?Physical Exam ?Well nourished and well developed. ?General: Alert and oriented x3, cooperative and pleasant, no acute distress. ?Head: normocephalic, atraumatic, neck supple. ?Eyes: EOMI. ? ?Musculoskeletal: ?Left knee exam: ?No palpable effusion, warmth or erythema ?Slight genu varum noted with slight flexion contracture ?Flexion to 120 degrees with tightness and mild crepitation anteriorly ?No significant lower extremity edema or erythema ? ?Calves soft and nontender. Motor function intact in LE. Strength 5/5 LE bilaterally. ?Neuro: Distal pulses 2+. Sensation to light touch intact in LE. ? ?Vital signs in last 24 hours: ?  ? ?Labs: ? ? ?Estimated body mass index is 30.21 kg/m? as calculated from the following: ?  Height as of 12/22/21: '5\' 4"'$  (1.626 m). ?  Weight as of 12/22/21: 79.8 kg. ? ? ?Imaging Review ?Plain radiographs demonstrate severe degenerative joint disease of the left knee(s). The overall alignment isneutral. The bone quality appears to be adequate for age and reported activity level. ? ? ? ? ? ?Assessment/Plan: ? ?End stage arthritis, left knee  ? ?The patient history, physical examination, clinical judgment of the provider and imaging studies are consistent with end stage degenerative joint disease of the left knee(s) and total knee arthroplasty is deemed medically necessary. The treatment options including medical management, injection  therapy arthroscopy and arthroplasty were discussed at length. The risks and benefits of total knee arthroplasty were presented and reviewed. The risks due to aseptic loosening, infection, stiffness, patella tracking problems, thromboembolic complications and other imponderables were discussed. The patient acknowledged the explanation, agreed to proceed with the plan and consent was signed. Patient is being admitted for inpatient treatment for surgery, pain control, PT, OT, prophylactic antibiotics, VTE prophylaxis, progressive ambulation and ADL's and discharge planning. The patient is planning to be discharged  home ? ?Therapy Plans: outpatient therapy [facility] ?Disposition: Home with sister (coming in town from [place]) ?Planned DVT Prophylaxis: aspirin '81mg'$  BID ?DME needed: none ?PCP: [facility], clearance received ?TXA: IV ?Allergies: PCN & compazine given together - seizures (in 1970s) ?Anesthesia Concerns: none ?  BMI: 31.6 ?Last HgbA1c: Not diabetic ? ?Other: ?- oxycodone, tylenol, celebrex, robaxin ? ? ?Patient's anticipated LOS is less than 2 midnights, meeting these requirements: ?- Younger than 13 ?- Lives within 1 hour of care ?- Has a competent adult at home to recover with post-op recover ?- NO history of ? - Chronic pain requiring opiods ? - Diabetes ? - Coronary Artery Disease ? - Heart failure ? - Heart attack ? - Stroke ? - DVT/VTE ? - Cardiac arrhythmia ? - Respiratory Failure/COPD ? - Renal failure ? - Anemia ? - Advanced Liver disease ? ?Costella Hatcher, PA-C ?Orthopedic Surgery ?[facility] [place] ?[phone] ? ?

## 2021-12-30 ENCOUNTER — Observation Stay (HOSPITAL_COMMUNITY)
Admission: RE | Admit: 2021-12-30 | Discharge: 2021-12-31 | Disposition: A | Discharge status: Home / Self Care | Payer: Medicare Other | Source: Ambulatory Visit | Attending: Orthopedic Surgery | Admitting: Orthopedic Surgery

## 2021-12-30 ENCOUNTER — Ambulatory Visit (HOSPITAL_BASED_OUTPATIENT_CLINIC_OR_DEPARTMENT_OTHER): Payer: Medicare Other | Admitting: Certified Registered Nurse Anesthetist

## 2021-12-30 ENCOUNTER — Encounter (HOSPITAL_COMMUNITY)
Admission: RE | Disposition: A | Discharge status: Home / Self Care | Payer: Self-pay | Source: Ambulatory Visit | Attending: Orthopedic Surgery

## 2021-12-30 ENCOUNTER — Ambulatory Visit (HOSPITAL_COMMUNITY): Payer: Medicare Other | Admitting: Emergency Medicine

## 2021-12-30 ENCOUNTER — Other Ambulatory Visit: Payer: Self-pay

## 2021-12-30 ENCOUNTER — Encounter (HOSPITAL_COMMUNITY): Payer: Self-pay | Admitting: Orthopedic Surgery

## 2021-12-30 DIAGNOSIS — M1712 Unilateral primary osteoarthritis, left knee: Secondary | ICD-10-CM | POA: Diagnosis present

## 2021-12-30 DIAGNOSIS — Z79899 Other long term (current) drug therapy: Secondary | ICD-10-CM | POA: Insufficient documentation

## 2021-12-30 DIAGNOSIS — Z85828 Personal history of other malignant neoplasm of skin: Secondary | ICD-10-CM | POA: Insufficient documentation

## 2021-12-30 DIAGNOSIS — J45909 Unspecified asthma, uncomplicated: Secondary | ICD-10-CM | POA: Diagnosis not present

## 2021-12-30 DIAGNOSIS — Z87891 Personal history of nicotine dependence: Secondary | ICD-10-CM | POA: Insufficient documentation

## 2021-12-30 DIAGNOSIS — Z96652 Presence of left artificial knee joint: Secondary | ICD-10-CM

## 2021-12-30 HISTORY — PX: TOTAL KNEE ARTHROPLASTY: SHX125

## 2021-12-30 LAB — TYPE AND SCREEN
ABO/RH(D): O POS
Antibody Screen: NEGATIVE

## 2021-12-30 LAB — ABO/RH: ABO/RH(D): O POS

## 2021-12-30 SURGERY — ARTHROPLASTY, KNEE, TOTAL
Anesthesia: Regional | Site: Knee | Laterality: Left

## 2021-12-30 MED ORDER — MENTHOL 3 MG MT LOZG: 1.0000 | LOZENGE | OROMUCOSAL | Status: DC | PRN

## 2021-12-30 MED ORDER — BUPIVACAINE-EPINEPHRINE (PF) 0.25% -1:200000 IJ SOLN
INTRAMUSCULAR | Status: DC | PRN
  Administered 2021-12-30: 30 mL

## 2021-12-30 MED ORDER — DOCUSATE SODIUM 100 MG PO CAPS
100.0000 mg | ORAL_CAPSULE | Freq: Two times a day (BID) | ORAL | Status: DC
  Administered 2021-12-30 – 2021-12-31 (×2): 100 mg via ORAL
  Filled 2021-12-30 (×3): qty 1

## 2021-12-30 MED ORDER — BUPIVACAINE-EPINEPHRINE (PF) 0.25% -1:200000 IJ SOLN
INTRAMUSCULAR | Status: AC
  Filled 2021-12-30: qty 30

## 2021-12-30 MED ORDER — POVIDONE-IODINE 10 % EX SWAB
2.0000 "application " | Freq: Once | CUTANEOUS | Status: AC
  Administered 2021-12-30: 2 via TOPICAL

## 2021-12-30 MED ORDER — MONTELUKAST SODIUM 10 MG PO TABS
10.0000 mg | ORAL_TABLET | Freq: Every day | ORAL | Status: DC
Start: 2021-12-30 — End: 2021-12-31
  Administered 2021-12-30: 10 mg via ORAL
  Filled 2021-12-30: qty 1

## 2021-12-30 MED ORDER — ONDANSETRON HCL 4 MG/2ML IJ SOLN
INTRAMUSCULAR | Status: AC
  Filled 2021-12-30: qty 2

## 2021-12-30 MED ORDER — ATORVASTATIN CALCIUM 40 MG PO TABS
40.0000 mg | ORAL_TABLET | Freq: Every day | ORAL | Status: DC
  Administered 2021-12-31: 40 mg via ORAL
  Filled 2021-12-30: qty 1

## 2021-12-30 MED ORDER — FLUTICASONE PROPIONATE 50 MCG/ACT NA SUSP
1.0000 | Freq: Every day | NASAL | Status: DC
  Administered 2021-12-31: 1 via NASAL
  Filled 2021-12-30: qty 16

## 2021-12-30 MED ORDER — OXYCODONE HCL 5 MG PO TABS: 5.0000 mg | ORAL_TABLET | Freq: Once | ORAL | Status: DC | PRN

## 2021-12-30 MED ORDER — VANCOMYCIN HCL 1000 MG IV SOLR
INTRAVENOUS | Status: AC
  Filled 2021-12-30: qty 20

## 2021-12-30 MED ORDER — PHENOL 1.4 % MT LIQD: 1.0000 | OROMUCOSAL | Status: DC | PRN

## 2021-12-30 MED ORDER — LACTATED RINGERS IV SOLN
INTRAVENOUS | Status: DC
Start: 2021-12-30 — End: 2021-12-30

## 2021-12-30 MED ORDER — FENTANYL CITRATE (PF) 100 MCG/2ML IJ SOLN
INTRAMUSCULAR | Status: AC
  Filled 2021-12-30: qty 2

## 2021-12-30 MED ORDER — CEFAZOLIN SODIUM-DEXTROSE 2-4 GM/100ML-% IV SOLN
2.0000 g | Freq: Four times a day (QID) | INTRAVENOUS | Status: AC
  Administered 2021-12-30 (×2): 2 g via INTRAVENOUS
  Filled 2021-12-30 (×2): qty 100

## 2021-12-30 MED ORDER — OXYCODONE HCL 5 MG/5ML PO SOLN: 5.0000 mg | Freq: Once | ORAL | Status: DC | PRN

## 2021-12-30 MED ORDER — METHOCARBAMOL 500 MG PO TABS
500.0000 mg | ORAL_TABLET | Freq: Four times a day (QID) | ORAL | Status: DC | PRN
  Administered 2021-12-30 – 2021-12-31 (×5): 500 mg via ORAL
  Filled 2021-12-30 (×5): qty 1

## 2021-12-30 MED ORDER — ACETAMINOPHEN 325 MG PO TABS
325.0000 mg | ORAL_TABLET | Freq: Four times a day (QID) | ORAL | Status: DC | PRN
  Administered 2021-12-30 – 2021-12-31 (×3): 650 mg via ORAL
  Filled 2021-12-30 (×3): qty 2

## 2021-12-30 MED ORDER — HYDROMORPHONE HCL 1 MG/ML IJ SOLN: 0.2500 mg | INTRAMUSCULAR | Status: DC | PRN

## 2021-12-30 MED ORDER — METHOCARBAMOL 500 MG IVPB - SIMPLE MED
500.0000 mg | Freq: Four times a day (QID) | INTRAVENOUS | Status: DC | PRN
  Filled 2021-12-30: qty 50

## 2021-12-30 MED ORDER — FERROUS SULFATE 325 (65 FE) MG PO TABS
325.0000 mg | ORAL_TABLET | Freq: Three times a day (TID) | ORAL | Status: DC
  Administered 2021-12-30 – 2021-12-31 (×2): 325 mg via ORAL
  Filled 2021-12-30 (×2): qty 1

## 2021-12-30 MED ORDER — SODIUM CHLORIDE 0.9 % IV SOLN: INTRAVENOUS | Status: DC

## 2021-12-30 MED ORDER — SODIUM CHLORIDE (PF) 0.9 % IJ SOLN
INTRAMUSCULAR | Status: DC | PRN
  Administered 2021-12-30: 30 mL

## 2021-12-30 MED ORDER — PROPOFOL 500 MG/50ML IV EMUL
INTRAVENOUS | Status: DC | PRN
  Administered 2021-12-30: 100 ug/kg/min via INTRAVENOUS

## 2021-12-30 MED ORDER — DEXAMETHASONE SODIUM PHOSPHATE 10 MG/ML IJ SOLN
8.0000 mg | Freq: Once | INTRAMUSCULAR | Status: AC
  Administered 2021-12-30: 8 mg via INTRAVENOUS

## 2021-12-30 MED ORDER — POLYETHYLENE GLYCOL 3350 17 G PO PACK: 17.0000 g | PACK | Freq: Every day | ORAL | Status: DC | PRN

## 2021-12-30 MED ORDER — KETOROLAC TROMETHAMINE 30 MG/ML IJ SOLN
INTRAMUSCULAR | Status: DC | PRN
  Administered 2021-12-30: 30 mg

## 2021-12-30 MED ORDER — ONDANSETRON HCL 4 MG/2ML IJ SOLN
INTRAMUSCULAR | Status: DC | PRN
  Administered 2021-12-30: 4 mg via INTRAVENOUS

## 2021-12-30 MED ORDER — LACTATED RINGERS IV SOLN: INTRAVENOUS | Status: DC

## 2021-12-30 MED ORDER — PROPOFOL 1000 MG/100ML IV EMUL
INTRAVENOUS | Status: AC
  Filled 2021-12-30: qty 100

## 2021-12-30 MED ORDER — POLYVINYL ALCOHOL 1.4 % OP SOLN
1.0000 [drp] | Freq: Every day | OPHTHALMIC | Status: DC
  Administered 2021-12-30: 1 [drp] via OPHTHALMIC
  Filled 2021-12-30: qty 15

## 2021-12-30 MED ORDER — CEFAZOLIN SODIUM-DEXTROSE 2-4 GM/100ML-% IV SOLN
2.0000 g | INTRAVENOUS | Status: AC
  Administered 2021-12-30: 2 g via INTRAVENOUS
  Filled 2021-12-30: qty 100

## 2021-12-30 MED ORDER — ORAL CARE MOUTH RINSE: 15.0000 mL | Freq: Once | OROMUCOSAL | Status: AC

## 2021-12-30 MED ORDER — PHENYLEPHRINE HCL-NACL 20-0.9 MG/250ML-% IV SOLN
INTRAVENOUS | Status: DC | PRN
  Administered 2021-12-30: 40 ug/min via INTRAVENOUS

## 2021-12-30 MED ORDER — SODIUM CHLORIDE (PF) 0.9 % IJ SOLN
INTRAMUSCULAR | Status: AC
Start: 2021-12-30 — End: ?
  Filled 2021-12-30: qty 30

## 2021-12-30 MED ORDER — PROPOFOL 10 MG/ML IV BOLUS
INTRAVENOUS | Status: DC | PRN
  Administered 2021-12-30 (×2): 30 mg via INTRAVENOUS

## 2021-12-30 MED ORDER — BUPIVACAINE IN DEXTROSE 0.75-8.25 % IT SOLN
INTRATHECAL | Status: DC | PRN
  Administered 2021-12-30: 1.6 mL via INTRATHECAL

## 2021-12-30 MED ORDER — DULOXETINE HCL 30 MG PO CPEP
30.0000 mg | ORAL_CAPSULE | Freq: Every day | ORAL | Status: DC
  Administered 2021-12-31: 30 mg via ORAL
  Filled 2021-12-30: qty 1

## 2021-12-30 MED ORDER — HYDROMORPHONE HCL 1 MG/ML IJ SOLN: 0.5000 mg | INTRAMUSCULAR | Status: DC | PRN

## 2021-12-30 MED ORDER — ONDANSETRON HCL 4 MG PO TABS: 4.0000 mg | ORAL_TABLET | Freq: Four times a day (QID) | ORAL | Status: DC | PRN

## 2021-12-30 MED ORDER — ASPIRIN 81 MG PO CHEW
81.0000 mg | CHEWABLE_TABLET | Freq: Two times a day (BID) | ORAL | Status: DC
  Administered 2021-12-30 – 2021-12-31 (×2): 81 mg via ORAL
  Filled 2021-12-30 (×2): qty 1

## 2021-12-30 MED ORDER — POTASSIUM CHLORIDE CRYS ER 20 MEQ PO TBCR
20.0000 meq | EXTENDED_RELEASE_TABLET | Freq: Every day | ORAL | Status: DC
  Administered 2021-12-30 – 2021-12-31 (×2): 20 meq via ORAL
  Filled 2021-12-30 (×2): qty 1

## 2021-12-30 MED ORDER — FAMOTIDINE 20 MG PO TABS
20.0000 mg | ORAL_TABLET | Freq: Every day | ORAL | Status: DC
  Administered 2021-12-30: 20 mg via ORAL
  Filled 2021-12-30: qty 1

## 2021-12-30 MED ORDER — TRANEXAMIC ACID-NACL 1000-0.7 MG/100ML-% IV SOLN
1000.0000 mg | INTRAVENOUS | Status: AC
Start: 2021-12-30 — End: 2021-12-30
  Administered 2021-12-30: 1000 mg via INTRAVENOUS
  Filled 2021-12-30: qty 100

## 2021-12-30 MED ORDER — TRANEXAMIC ACID-NACL 1000-0.7 MG/100ML-% IV SOLN
1000.0000 mg | Freq: Once | INTRAVENOUS | Status: AC
  Administered 2021-12-30: 1000 mg via INTRAVENOUS
  Filled 2021-12-30: qty 100

## 2021-12-30 MED ORDER — METOCLOPRAMIDE HCL 5 MG PO TABS: 5.0000 mg | ORAL_TABLET | Freq: Three times a day (TID) | ORAL | Status: DC | PRN

## 2021-12-30 MED ORDER — KETOROLAC TROMETHAMINE 30 MG/ML IJ SOLN
INTRAMUSCULAR | Status: AC
  Filled 2021-12-30: qty 1

## 2021-12-30 MED ORDER — GABAPENTIN 300 MG PO CAPS
300.0000 mg | ORAL_CAPSULE | Freq: Every day | ORAL | Status: DC
  Administered 2021-12-30: 300 mg via ORAL
  Filled 2021-12-30: qty 1

## 2021-12-30 MED ORDER — BISACODYL 10 MG RE SUPP: 10.0000 mg | Freq: Every day | RECTAL | Status: DC | PRN

## 2021-12-30 MED ORDER — METOCLOPRAMIDE HCL 5 MG/ML IJ SOLN: 5.0000 mg | Freq: Three times a day (TID) | INTRAMUSCULAR | Status: DC | PRN

## 2021-12-30 MED ORDER — ONDANSETRON HCL 4 MG/2ML IJ SOLN: 4.0000 mg | Freq: Four times a day (QID) | INTRAMUSCULAR | Status: DC | PRN

## 2021-12-30 MED ORDER — ROPIVACAINE HCL 5 MG/ML IJ SOLN
INTRAMUSCULAR | Status: DC | PRN
  Administered 2021-12-30: 20 mL via PERINEURAL

## 2021-12-30 MED ORDER — DEXAMETHASONE SODIUM PHOSPHATE 10 MG/ML IJ SOLN
10.0000 mg | Freq: Once | INTRAMUSCULAR | Status: AC
  Administered 2021-12-31: 10 mg via INTRAVENOUS
  Filled 2021-12-30: qty 1

## 2021-12-30 MED ORDER — PHENYLEPHRINE HCL-NACL 20-0.9 MG/250ML-% IV SOLN
INTRAVENOUS | Status: AC
  Filled 2021-12-30: qty 500

## 2021-12-30 MED ORDER — OXYCODONE HCL 5 MG PO TABS
10.0000 mg | ORAL_TABLET | ORAL | Status: DC | PRN
  Administered 2021-12-30: 15 mg via ORAL
  Administered 2021-12-31: 10 mg via ORAL
  Filled 2021-12-30: qty 2
  Filled 2021-12-30: qty 3
  Filled 2021-12-30: qty 2

## 2021-12-30 MED ORDER — DIPHENHYDRAMINE HCL 12.5 MG/5ML PO ELIX: 12.5000 mg | ORAL_SOLUTION | ORAL | Status: DC | PRN

## 2021-12-30 MED ORDER — OXYCODONE HCL 5 MG PO TABS
5.0000 mg | ORAL_TABLET | ORAL | Status: DC | PRN
  Administered 2021-12-30: 5 mg via ORAL
  Administered 2021-12-31 (×2): 10 mg via ORAL
  Filled 2021-12-30 (×2): qty 2

## 2021-12-30 MED ORDER — CHLORHEXIDINE GLUCONATE 0.12 % MT SOLN
15.0000 mL | Freq: Once | OROMUCOSAL | Status: AC
  Administered 2021-12-30: 15 mL via OROMUCOSAL

## 2021-12-30 MED ORDER — DEXAMETHASONE SODIUM PHOSPHATE 10 MG/ML IJ SOLN
INTRAMUSCULAR | Status: AC
  Filled 2021-12-30: qty 1

## 2021-12-30 MED ORDER — LORATADINE 10 MG PO TABS
10.0000 mg | ORAL_TABLET | Freq: Every day | ORAL | Status: DC
  Administered 2021-12-31: 10 mg via ORAL
  Filled 2021-12-30 (×2): qty 1

## 2021-12-30 MED ORDER — FENTANYL CITRATE (PF) 100 MCG/2ML IJ SOLN
INTRAMUSCULAR | Status: DC | PRN
  Administered 2021-12-30 (×2): 50 ug via INTRAVENOUS

## 2021-12-30 SURGICAL SUPPLY — 57 items
ATTUNE MED ANAT PAT 32 KNEE (Knees) ×1 IMPLANT
ATTUNE PSFEM LTSZ4 NARCEM KNEE (Femur) ×1 IMPLANT
ATTUNE PSRP INSR SZ4 8 KNEE (Insert) ×1 IMPLANT
BAG COUNTER SPONGE SURGICOUNT (BAG) IMPLANT
BAG ZIPLOCK 12X15 (MISCELLANEOUS) IMPLANT
BASEPLATE TIBIAL ROTATING SZ 4 (Knees) ×1 IMPLANT
BLADE SAW SGTL 11.0X1.19X90.0M (BLADE) IMPLANT
BLADE SAW SGTL 13.0X1.19X90.0M (BLADE) ×2 IMPLANT
BNDG ELASTIC 6X5.8 VLCR STR LF (GAUZE/BANDAGES/DRESSINGS) ×2 IMPLANT
BOWL SMART MIX CTS (DISPOSABLE) ×2 IMPLANT
CEMENT HV SMART SET (Cement) ×2 IMPLANT
CUFF TOURN SGL QUICK 34 (TOURNIQUET CUFF) ×1
CUFF TRNQT CYL 34X4.125X (TOURNIQUET CUFF) ×1 IMPLANT
DERMABOND ADVANCED (GAUZE/BANDAGES/DRESSINGS) ×1
DERMABOND ADVANCED .7 DNX12 (GAUZE/BANDAGES/DRESSINGS) ×1 IMPLANT
DRAPE SHEET LG 3/4 BI-LAMINATE (DRAPES) ×2 IMPLANT
DRAPE U-SHAPE 47X51 STRL (DRAPES) ×2 IMPLANT
DRESSING AQUACEL AG ADV 3.5X12 (MISCELLANEOUS) IMPLANT
DRESSING AQUACEL AG SP 3.5X10 (GAUZE/BANDAGES/DRESSINGS) ×1 IMPLANT
DRSG AQUACEL AG ADV 3.5X12 (MISCELLANEOUS) ×2
DRSG AQUACEL AG SP 3.5X10 (GAUZE/BANDAGES/DRESSINGS) ×2
DURAPREP 26ML APPLICATOR (WOUND CARE) ×4 IMPLANT
ELECT REM PT RETURN 15FT ADLT (MISCELLANEOUS) ×2 IMPLANT
FACESHIELD WRAPAROUND (MASK) ×4 IMPLANT
FACESHIELD WRAPAROUND OR TEAM (MASK) ×2 IMPLANT
GLOVE BIO SURGEON STRL SZ 6 (GLOVE) ×2 IMPLANT
GLOVE BIO SURGEON STRL SZ7.5 (GLOVE) ×2 IMPLANT
GLOVE BIOGEL PI IND STRL 6.5 (GLOVE) ×1 IMPLANT
GLOVE BIOGEL PI IND STRL 7.5 (GLOVE) ×1 IMPLANT
GLOVE BIOGEL PI INDICATOR 6.5 (GLOVE) ×1
GLOVE BIOGEL PI INDICATOR 7.5 (GLOVE) ×1
GOWN STRL REUS W/ TWL LRG LVL3 (GOWN DISPOSABLE) ×2 IMPLANT
GOWN STRL REUS W/TWL LRG LVL3 (GOWN DISPOSABLE) ×2
HANDPIECE INTERPULSE COAX TIP (DISPOSABLE) ×1
HOLDER FOLEY CATH W/STRAP (MISCELLANEOUS) IMPLANT
KIT TURNOVER KIT A (KITS) IMPLANT
MANIFOLD NEPTUNE II (INSTRUMENTS) ×2 IMPLANT
NDL SAFETY ECLIPSE 18X1.5 (NEEDLE) IMPLANT
NEEDLE HYPO 18GX1.5 SHARP (NEEDLE)
NS IRRIG 1000ML POUR BTL (IV SOLUTION) ×2 IMPLANT
PACK TOTAL KNEE CUSTOM (KITS) ×2 IMPLANT
PROTECTOR NERVE ULNAR (MISCELLANEOUS) ×2 IMPLANT
SET HNDPC FAN SPRY TIP SCT (DISPOSABLE) ×1 IMPLANT
SET PAD KNEE POSITIONER (MISCELLANEOUS) ×2 IMPLANT
SPIKE FLUID TRANSFER (MISCELLANEOUS) ×4 IMPLANT
SUT MNCRL AB 4-0 PS2 18 (SUTURE) ×2 IMPLANT
SUT STRATAFIX PDS+ 0 24IN (SUTURE) ×2 IMPLANT
SUT VIC AB 1 CT1 36 (SUTURE) ×3 IMPLANT
SUT VIC AB 2-0 CT1 27 (SUTURE) ×2
SUT VIC AB 2-0 CT1 TAPERPNT 27 (SUTURE) ×2 IMPLANT
SYR 3ML LL SCALE MARK (SYRINGE) ×2 IMPLANT
TOWEL GREEN STERILE FF (TOWEL DISPOSABLE) ×2 IMPLANT
TRAY CATH INTERMITTENT SS 16FR (CATHETERS) ×2 IMPLANT
TRAY FOLEY MTR SLVR 16FR STAT (SET/KITS/TRAYS/PACK) ×2 IMPLANT
TUBE SUCTION HIGH CAP CLEAR NV (SUCTIONS) ×2 IMPLANT
WATER STERILE IRR 1000ML POUR (IV SOLUTION) ×4 IMPLANT
WRAP KNEE MAXI GEL POST OP (GAUZE/BANDAGES/DRESSINGS) ×2 IMPLANT

## 2021-12-30 NOTE — Evaluation (Signed)
Physical Therapy Evaluation ?Patient Details ?Name: Hannah Charles ?MRN: [id number] ?DOB: 1946/04/13 ?Today's Date: 12/30/2021 ? ?History of Present Illness ? Pt is a 76yo female presenting s/p L-TKA on 12/30/21. PMH: OA, depression, several foot and back surgeries.  ?Clinical Impression ? Hannah Charles is a 76 y.o. female POD 0 s/p L-TKA. Patient reports independence with mobility at baseline. Patient is now limited by functional impairments (see PT problem list below) and requires min guard for transfers and gait with RW. Patient was able to ambulate 15 feet with RW and min guard assist. Patient instructed in exercise to facilitate ROM and circulation to manage edema. Patient will benefit from continued skilled PT interventions to address impairments and progress towards PLOF. Acute PT will follow to progress mobility and stair training in preparation for safe discharge home.   ?   ? ?Recommendations for follow up therapy are one component of a multi-disciplinary discharge planning process, led by the attending physician.  Recommendations may be updated based on patient status, additional functional criteria and insurance authorization. ? ?Follow Up Recommendations Follow physician's recommendations for discharge plan and follow up therapies ? ?  ?Assistance Recommended at Discharge Set up Supervision/Assistance  ?Patient can return home with the following ? A little help with walking and/or transfers;A little help with bathing/dressing/bathroom;Assistance with cooking/housework;Assist for transportation;Help with stairs or ramp for entrance ? ?  ?Equipment Recommendations None recommended by PT  ?Recommendations for Other Services ?    ?  ?Functional Status Assessment Patient has had a recent decline in their functional status and demonstrates the ability to make significant improvements in function in a reasonable and predictable amount of time.  ? ?  ?Precautions / Restrictions Precautions ?Precautions:  Fall ?Restrictions ?Weight Bearing Restrictions: No ?LLE Weight Bearing: Weight bearing as tolerated  ? ?  ? ?Mobility ? Bed Mobility ?Overal bed mobility: Needs Assistance ?Bed Mobility: Supine to Sit ?  ?  ?Supine to sit: Supervision, HOB elevated ?  ?  ?General bed mobility comments: Pt supervision with HOB elevated, no physical assist required, for safety only. ?  ? ?Transfers ?Overall transfer level: Needs assistance ?Equipment used: Rolling walker (2 wheels) ?Transfers: Sit to/from Stand ?Sit to Stand: Min guard ?  ?  ?  ?  ?  ?General transfer comment: Pt min guard for safety only, no physical assist or cuing required. ?  ? ?Ambulation/Gait ?Ambulation/Gait assistance: Min guard ?Gait Distance (Feet): 15 Feet ?Assistive device: Rolling walker (2 wheels) ?Gait Pattern/deviations: Step-to pattern, Decreased step length - right, Decreased stance time - left ?Gait velocity: decreased ?  ?  ?General Gait Details: Pt ambulated with RW and min guard assist, no overt LOB noted or physical assist required. Pt demonstrated safe walker technique with step-to pattern and mildly shortened step length and stance time. Some drifting L that was corrected with cuing. ? ?Stairs ?  ?  ?  ?  ?  ? ?Wheelchair Mobility ?  ? ?Modified Rankin (Stroke Patients Only) ?  ? ?  ? ?Balance Overall balance assessment: Needs assistance ?Sitting-balance support: Feet unsupported, No upper extremity supported ?Sitting balance-Leahy Scale: Good ?  ?  ?Standing balance support: Bilateral upper extremity supported, During functional activity, Reliant on assistive device for balance ?Standing balance-Leahy Scale: Poor ?  ?  ?  ?  ?  ?  ?  ?  ?  ?  ?  ?  ?   ? ? ? ?Pertinent Vitals/Pain Pain Assessment ?Pain Assessment: 0-10 ?Pain Score:  6  ?Pain Location: L knee ?Pain Descriptors / Indicators: Operative site guarding, Discomfort ?Pain Intervention(s): Limited activity within patient's tolerance, Monitored during session, Repositioned, Ice applied   ? ? ?Home Living Family/patient expects to be discharged to:: Private residence ?Living Arrangements: Alone ?Available Help at Discharge: Family;Available 24 hours/day (Sister for couple weeks) ?Type of Home: House ?Home Access: Level entry ?  ?  ?  ?Home Layout: One level ?Home Equipment: Toilet riser;Cane - single point;Rolling Walker (2 wheels) ?   ?  ?Prior Function Prior Level of Function : Independent/Modified Independent ?  ?  ?  ?  ?  ?  ?Mobility Comments: ind ?ADLs Comments: ind ?  ? ? ?Hand Dominance  ? Dominant Hand: Right ? ?  ?Extremity/Trunk Assessment  ? Upper Extremity Assessment ?Upper Extremity Assessment: Overall WFL for tasks assessed ?  ? ?Lower Extremity Assessment ?Lower Extremity Assessment: RLE deficits/detail;LLE deficits/detail ?RLE Deficits / Details: MMT ank DF/PF 5/5 ?RLE Sensation: WNL ?LLE Deficits / Details: MMT ank DF/PF 5/5, no extensor lag noted ?LLE Sensation: decreased light touch (still slightly numb after surgery) ?  ? ?Cervical / Trunk Assessment ?Cervical / Trunk Assessment: Back Surgery  ?Communication  ? Communication: No difficulties  ?Cognition Arousal/Alertness: Awake/alert ?Behavior During Therapy: [facility] for tasks assessed/performed ?Overall Cognitive Status: Within Functional Limits for tasks assessed ?  ?  ?  ?  ?  ?  ?  ?  ?  ?  ?  ?  ?  ?  ?  ?  ?  ?  ?  ? ?  ?General Comments General comments (skin integrity, edema, etc.): Reviewed incentive spirometry ? ?  ?Exercises Total Joint Exercises ?Ankle Circles/Pumps: AROM, 20 reps  ? ?Assessment/Plan  ?  ?PT Assessment Patient needs continued PT services  ?PT Problem List Decreased strength;Decreased range of motion;Decreased activity tolerance;Decreased balance;Decreased mobility;Decreased coordination;Decreased knowledge of use of DME;Pain ? ?   ?  ?PT Treatment Interventions DME instruction;Gait training;Stair training;Functional mobility training;Therapeutic activities;Therapeutic exercise;Balance training;Cognitive  remediation   ? ?PT Goals (Current goals can be found in the Care Plan section)  ?Acute Rehab PT Goals ?Patient Stated Goal: Home ?PT Goal Formulation: With patient ?Time For Goal Achievement: 01/06/22 ?Potential to Achieve Goals: Good ? ?  ?Frequency 7X/week ?  ? ? ?Co-evaluation   ?  ?  ?  ?  ? ? ?  ?AM-PAC PT "6 Clicks" Mobility  ?Outcome Measure Help needed turning from your back to your side while in a flat bed without using bedrails?: None ?Help needed moving from lying on your back to sitting on the side of a flat bed without using bedrails?: None ?Help needed moving to and from a bed to a chair (including a wheelchair)?: A Little ?Help needed standing up from a chair using your arms (e.g., wheelchair or bedside chair)?: A Little ?Help needed to walk in hospital room?: A Little ?Help needed climbing 3-5 steps with a railing? : A Little ?6 Click Score: 20 ? ?  ?End of Session Equipment Utilized During Treatment: Gait belt ?Activity Tolerance: Patient tolerated treatment well ?Patient left: in chair;with call bell/phone within reach;with chair alarm set;with SCD's reapplied ?Nurse Communication: Mobility status ?PT Visit Diagnosis: Difficulty in walking, not elsewhere classified (R26.2);Pain ?Pain - Right/Left: Left ?Pain - part of body: Knee ?  ? ?Time: 8527-7824 ?PT Time Calculation (min) (ACUTE ONLY): 25 min ? ? ?Charges:   PT Evaluation ?$PT Eval Low Complexity: 1 Low ?PT Treatments ?$Gait Training: 8-22 mins ?  ?   ? ? ?  Coolidge Breeze, PT, DPT ?[facility] Rehabilitation Department ?Office: [phone] ?Pager: [phone] ? ?Coolidge Breeze ?12/30/2021, 8:07 PM ? ?

## 2021-12-30 NOTE — Transfer of Care (Signed)
Immediate Anesthesia Transfer of Care Note ? ?Patient: Hannah Charles ? ?Procedure(s) Performed: TOTAL KNEE ARTHROPLASTY (Left: Knee) ? ?Patient Location: PACU ? ?Anesthesia Type:Spinal ? ?Level of Consciousness: awake, alert , oriented and patient cooperative ? ?Airway & Oxygen Therapy: Patient Spontanous Breathing and Patient connected to face mask ? ?Post-op Assessment: Report given to RN and Post -op Vital signs reviewed and stable ? ?Post vital signs: Reviewed and stable ? ?Last Vitals:  ?Vitals Value Taken Time  ?BP 123/55 12/30/21 0905  ?Temp    ?Pulse 90 12/30/21 0908  ?Resp 16 12/30/21 0908  ?SpO2 93 % 12/30/21 0908  ?Vitals shown include unvalidated device data. ? ?Last Pain:  ?Vitals:  ? 12/30/21 0542  ?TempSrc:   ?PainSc: 0-No pain  ?   ? ?  ? ?Complications: No notable events documented. ?

## 2021-12-30 NOTE — Anesthesia Postprocedure Evaluation (Signed)
Anesthesia Post Note ? ?Patient: Hannah Charles ? ?Procedure(s) Performed: TOTAL KNEE ARTHROPLASTY (Left: Knee) ? ?  ? ?Patient location during evaluation: PACU ?Anesthesia Type: Regional and Spinal ?Level of consciousness: awake and alert ?Pain management: pain level controlled ?Vital Signs Assessment: post-procedure vital signs reviewed and stable ?Respiratory status: spontaneous breathing, nonlabored ventilation and respiratory function stable ?Cardiovascular status: blood pressure returned to baseline and stable ?Postop Assessment: no apparent nausea or vomiting ?Anesthetic complications: no ? ? ?No notable events documented. ? ?Last Vitals:  ?Vitals:  ? 12/30/21 0945 12/30/21 1001  ?BP: 131/80 140/71  ?Pulse: 83 82  ?Resp: 12 16  ?Temp: (!) 36.4 ?C 36.6 ?C  ?SpO2: 93% 98%  ?  ?Last Pain:  ?Vitals:  ? 12/30/21 1001  ?TempSrc: Oral  ?PainSc: 0-No pain  ? ? ?  ?  ?  ?  ?  ?  ? ?Lynda Rainwater ? ? ? ? ?

## 2021-12-30 NOTE — Interval H&P Note (Signed)
History and Physical Interval Note: ? ?12/30/2021 ?7:04 AM ? ?Hannah Charles  has presented today for surgery, with the diagnosis of Left knee osteoarthritis.  The various methods of treatment have been discussed with the patient and family. After consideration of risks, benefits and other options for treatment, the patient has consented to  Procedure(s): ?TOTAL KNEE ARTHROPLASTY (Left) as a surgical intervention.  The patient's history has been reviewed, patient examined, no change in status, stable for surgery.  I have reviewed the patient's chart and labs.  Questions were answered to the patient's satisfaction.   ? ? ?Mauri Pole ? ? ?

## 2021-12-30 NOTE — Anesthesia Procedure Notes (Signed)
Anesthesia Regional Block: Adductor canal block  ? ?Pre-Anesthetic Checklist: , timeout performed,  Correct Patient, Correct Site, Correct Laterality,  Correct Procedure, Correct Position, site marked,  Risks and benefits discussed,  Surgical consent,  Pre-op evaluation,  At surgeon's request and post-op pain management ? ?Laterality: Left ? ?Prep: chloraprep     ?  ?Needles:  ?Injection technique: Single-shot ? ?Needle Type: Stimiplex   ? ? ?Needle Length: 9cm  ?Needle Gauge: 21  ? ? ? ?Additional Needles: ? ? ?Procedures:,,,, ultrasound used (permanent image in chart),,    ?Narrative:  ?Start time: 12/30/2021 6:59 AM ?End time: 12/30/2021 7:04 AM ?Injection made incrementally with aspirations every 5 mL. ? ?Performed by: Personally  ?Anesthesiologist: Lynda Rainwater, MD ? ? ? ? ?

## 2021-12-30 NOTE — Anesthesia Procedure Notes (Signed)
Spinal ? ?Patient location during procedure: OR ?Start time: 12/30/2021 7:18 AM ?End time: 12/30/2021 7:21 AM ?Reason for block: surgical anesthesia ?Staffing ?Performed: resident/CRNA  ?Anesthesiologist: Lynda Rainwater, MD ?Resident/CRNA: Claudia Desanctis, CRNA ?Preanesthetic Checklist ?Completed: patient identified, IV checked, site marked, risks and benefits discussed, surgical consent, monitors and equipment checked, pre-op evaluation and timeout performed ?Spinal Block ?Patient position: sitting ?Prep: DuraPrep ?Patient monitoring: heart rate, cardiac monitor, continuous pulse ox and blood pressure ?Approach: midline ?Location: L3-4 ?Injection technique: single-shot ?Needle ?Needle type: Sprotte and Pencan  ?Needle gauge: 24 G ?Needle length: 9 cm ?Needle insertion depth: 8 cm ?Assessment ?Sensory level: T4 ?Events: CSF return ? ? ? ?

## 2021-12-30 NOTE — Op Note (Signed)
NAME:  Hannah Charles                  ?   ? MEDICAL RECORD NO.:  [id number]  ?   ?                        FACILITY:  [facility]  ?   ? PHYSICIAN:  Pietro Cassis. Alvan Dame, M.D.  DATE OF BIRTH:  08/02/1946  ?   ? DATE OF PROCEDURE:  12/30/2021   ?  ?   ?                             OPERATIVE REPORT  ?   ?   ? PREOPERATIVE DIAGNOSIS:  Left knee osteoarthritis.  ?   ? POSTOPERATIVE DIAGNOSIS:  Left knee osteoarthritis.  ?   ? FINDINGS:  The patient was noted to have complete loss of cartilage and  ? bone-on-bone arthritis with associated osteophytes in the medial and patellofemoral compartments of  ? the knee with a significant synovitis and associated effusion.  The patient had failed months of conservative treatment including medications, injection therapy, activity modification. ?   ? PROCEDURE:  Left total knee replacement.  ?   ? COMPONENTS USED:  DePuy Attune rotating platform posterior stabilized knee  ? system, a size 4N femur, 4 tibia, size 8 mm PS AOX insert, and 32 anatomic patellar  ? button.  ?   ? SURGEON:  Pietro Cassis. Alvan Dame, M.D.  ?   ? ASSISTANT:  Costella Hatcher, PA-C.  ?   ? ANESTHESIA:  Regional and Spinal.  ?   ? SPECIMENS:  None.  ?   ? COMPLICATION:  None.  ?   ? DRAINS:  None. ? ?EBL: <100 cc  ?   ? TOURNIQUET TIME:   ?Total Tourniquet Time Documented: ?Thigh (Left) - 29 minutes ?Total: Thigh (Left) - 29 minutes ? .  ?   ? The patient was stable to the recovery room.  ?   ? INDICATION FOR PROCEDURE:  Hannah Charles is a 76 y.o. female patient of  ? mine.  The patient had been seen, evaluated, and treated for months conservatively in the  ? office with medication, activity modification, and injections.  The patient had  ? radiographic changes of bone-on-bone arthritis with endplate sclerosis and osteophytes noted.  Based on the radiographic changes and failed conservative measures, the patient  ? decided to proceed with definitive treatment, total knee replacement.  Risks of infection, DVT, component  failure, need for revision surgery, neurovascular injury were reviewed in the office setting.  The postop course was reviewed stressing the efforts to maximize post-operative satisfaction and function.  Consent was obtained for benefit of pain  ? relief.  ?   ? PROCEDURE IN DETAIL:  The patient was brought to the operative theater.  ? Once adequate anesthesia, preoperative antibiotics, 2 gm of Ancef,1 gm of Tranexamic Acid, and 10 mg of Decadron administered, the patient was positioned supine with a left thigh tourniquet placed.  The  ?left lower extremity was prepped and draped in sterile fashion.  A time-  ? out was performed identifying the patient, planned procedure, and the appropriate extremity.  ?   ? The left lower extremity was placed in the [facility] leg holder.  The leg was  ? exsanguinated, tourniquet elevated to 250 mmHg.  A midline incision was  ?  made followed by median parapatellar arthrotomy.  Following initial  ? exposure, attention was first directed to the patella.  Precut  ? measurement was noted to be 22 mm.  I resected down to 13-14 mm and used a  ? 32 anatomic patellar button to restore patellar height as well as cover the cut surface.  ?   ? The lug holes were drilled and a metal shim was placed to protect the  ? patella from retractors and saw blade during the procedure.  ?   ? At this point, attention was now directed to the femur.  The femoral  ? canal was opened with a drill, irrigated to try to prevent fat emboli.  An  ? intramedullary rod was passed at 3 degrees valgus, 9 mm of bone was  ? resected off the distal femur.  Following this resection, the tibia was  ? subluxated anteriorly.  Using the extramedullary guide, 2 mm of bone was resected off  ? the proximal medial tibia.  We confirmed the gap would be  ? stable medially and laterally with a size 6 spacer block as well as confirmed that the tibial cut was perpendicular in the coronal plane, checking with an alignment rod.  ?   ? Once  this was done, I sized the femur to be a size 4 in the anterior-  ? posterior dimension, chose a narrow component based on medial and  ? lateral dimension.  The size 4 rotation block was then pinned in  ? position anterior referenced using the C-clamp to set rotation.  The  ? anterior, posterior, and  chamfer cuts were made without difficulty nor  ? notching making certain that I was along the anterior cortex to help  ? with flexion gap stability.  ?   ? The final box cut was made off the lateral aspect of distal femur.  ?   ? At this point, the tibia was sized to be a size 4.  The size 4 tray was  ? then pinned in position through the medial third of the tubercle,  ? drilled, and keel punched.  Trial reduction was now carried with a 4 femur, ? 4 tibia, a size 8 mm PS insert, and the 32 anatomic patella botton.  The knee was brought to full extension with good flexion stability with the patella  ? tracking through the trochlea without application of pressure.  Given  ? all these findings the trial components removed.  Final components were  ? opened and cement was mixed.  The knee was irrigated with normal saline solution and pulse lavage.  The synovial lining was  ? then injected with 30 cc of 0.25% Marcaine with epinephrine, 1 cc of Toradol and 30 cc of NS for a total of 61 cc.  ?   ?Final implants were then cemented onto cleaned and dried cut surfaces of bone with the knee brought to extension with a size 8 mm PS trial insert.  ?   ? Once the cement had fully cured, excess cement was removed  ? throughout the knee.  I confirmed that I was satisfied with the range of  ? motion and stability, and the final size 8 mm PS AOX insert was chosen.  It was  ? placed into the knee.  ?   ? The tourniquet had been let down at 29 minutes.  No significant  ? hemostasis was required.  The extensor mechanism was then reapproximated using #1 Vicryl  and #1 Stratafix sutures with the knee  ? in flexion.  The  ? remaining wound was  closed with 2-0 Vicryl and running 4-0 Monocryl.  ? The knee was cleaned, dried, dressed sterilely using Dermabond and  ? Aquacel dressing.  The patient was then  ? brought to recovery room in stable condition, tolerating the procedure  ? well.  ? ?Please note that Physician Assistant, Costella Hatcher, PA-C was present for the entirety of the case, and was utilized for pre-operative positioning, peri-operative retractor management, general facilitation of the procedure and for primary wound closure at the end of the case. ?   ?   ?   ?   ? Pietro Cassis Alvan Dame, M.D.  ? ? ?12/30/2021 8:40 AM  ?

## 2021-12-30 NOTE — Anesthesia Preprocedure Evaluation (Signed)
Anesthesia Evaluation  ?Patient identified by MRN, date of birth, ID band ?Patient awake ? ? ? ?Reviewed: ?Allergy & Precautions, NPO status , Patient's Chart, lab work & pertinent test results ? ?Airway ?Mallampati: II ? ?TM Distance: >3 FB ?Neck ROM: Full ? ? ? Dental ? ?(+) Teeth Intact, Dental Advisory Given,  ?  ?Pulmonary ?asthma , former smoker,  ?Inhalers: symbicort ?singulair ?  ?Pulmonary exam normal ?breath sounds clear to auscultation ? ? ? ? ? ? Cardiovascular ?negative cardio ROS ?Normal cardiovascular exam ?Rhythm:Regular Rate:Normal ? ? ?  ?Neuro/Psych ?PSYCHIATRIC DISORDERS Depression negative neurological ROS ?   ? GI/Hepatic ?negative GI ROS, Neg liver ROS,   ?Endo/Other  ?negative endocrine ROS ? Renal/GU ?negative Renal ROS  ?negative genitourinary ?  ?Musculoskeletal ? ?(+) Arthritis , Osteoarthritis,   ? Abdominal ?(+) + obese,   ?Peds ?negative pediatric ROS ?(+)  Hematology ?negative hematology ROS ?(+)   ?Anesthesia Other Findings ?Right 2nd/3rd/4th hammertoes, metatarsalgia  ? ?HLD ? Reproductive/Obstetrics ?negative OB ROS ? ?  ? ? ? ? ? ? ? ? ? ? ? ? ? ?  ?  ? ? ? ? ? ? ? ? ?Anesthesia Physical ? ?Anesthesia Plan ? ?ASA: II ? ?Anesthesia Plan: Regional and Spinal  ? ?Post-op Pain Management: GA combined w/ Regional for post-op pain and Regional block* and Minimal or no pain anticipated  ? ?Induction: Intravenous ? ?PONV Risk Score and Plan: 2 and Propofol infusion, TIVA and Treatment may vary due to age or medical condition ? ?Airway Management Planned: Simple Face Mask ? ?Additional Equipment: None ? ?Intra-op Plan:  ? ?Post-operative Plan:  ? ?Informed Consent: I have reviewed the patients History and Physical, chart, labs and discussed the procedure including the risks, benefits and alternatives for the proposed anesthesia with the patient or authorized representative who has indicated his/her understanding and acceptance.  ? ? ? ?Dental advisory  given ? ?Plan Discussed with: CRNA ? ?Anesthesia Plan Comments:   ? ? ? ? ? ? ?Anesthesia Quick Evaluation ? ?

## 2021-12-30 NOTE — Discharge Instructions (Signed)

## 2021-12-31 ENCOUNTER — Encounter (HOSPITAL_COMMUNITY): Payer: Self-pay | Admitting: Orthopedic Surgery

## 2021-12-31 DIAGNOSIS — M1712 Unilateral primary osteoarthritis, left knee: Secondary | ICD-10-CM | POA: Diagnosis not present

## 2021-12-31 LAB — CBC
HCT: 36.7 % (ref 36.0–46.0)
Hemoglobin: 12.5 g/dL (ref 12.0–15.0)
MCH: 31.3 pg (ref 26.0–34.0)
MCHC: 34.1 g/dL (ref 30.0–36.0)
MCV: 91.8 fL (ref 80.0–100.0)
Platelets: 221 10*3/uL (ref 150–400)
RBC: 4 MIL/uL (ref 3.87–5.11)
RDW: 13.4 % (ref 11.5–15.5)
WBC: 16.5 10*3/uL — ABNORMAL HIGH (ref 4.0–10.5)
nRBC: 0 % (ref 0.0–0.2)

## 2021-12-31 LAB — BASIC METABOLIC PANEL
Anion gap: 4 — ABNORMAL LOW (ref 5–15)
BUN: 20 mg/dL (ref 8–23)
CO2: 26 mmol/L (ref 22–32)
Calcium: 8.8 mg/dL — ABNORMAL LOW (ref 8.9–10.3)
Chloride: 107 mmol/L (ref 98–111)
Creatinine, Ser: 0.73 mg/dL (ref 0.44–1.00)
GFR, Estimated: >60 mL/min (ref >60)
Glucose, Bld: 131 mg/dL — ABNORMAL HIGH (ref 70–99)
Potassium: 4.8 mmol/L (ref 3.5–5.1)
Sodium: 137 mmol/L (ref 135–145)

## 2021-12-31 MED ORDER — METHOCARBAMOL 500 MG PO TABS: 500.0000 mg | ORAL_TABLET | Freq: Four times a day (QID) | ORAL | 0 refills | Status: DC | PRN

## 2021-12-31 MED ORDER — OXYCODONE HCL 5 MG PO TABS: 5.0000 mg | ORAL_TABLET | ORAL | 0 refills | Status: DC | PRN

## 2021-12-31 MED ORDER — ASPIRIN 81 MG PO CHEW
81.0000 mg | CHEWABLE_TABLET | Freq: Two times a day (BID) | ORAL | 0 refills | Status: AC
Start: 2021-12-31 — End: 2022-01-28

## 2021-12-31 MED ORDER — DOCUSATE SODIUM 100 MG PO CAPS: 100.0000 mg | ORAL_CAPSULE | Freq: Two times a day (BID) | ORAL | 0 refills | Status: AC

## 2021-12-31 MED ORDER — POLYETHYLENE GLYCOL 3350 17 G PO PACK: 17.0000 g | PACK | Freq: Every day | ORAL | 0 refills | Status: AC | PRN

## 2021-12-31 NOTE — TOC Transition Note (Signed)
Transition of Care (TOC) - CM/SW Discharge Note ? ?Patient Details  ?Name: Hannah Charles ?MRN: [id number] ?Date of Birth: 01/19/1946 ? ?Transition of Care (TOC) CM/SW Contact:  ?Megan S Glenn, LCSW ?Phone Number: ?12/31/2021, 11:17 AM ? ?Clinical Narrative: Patient is expected to discharge home after working with PT. CSW met with patient to confirm discharge plan. Patient will go home with OPPT at [facility]. Patient has a rolling walker, shower stool, and toilet riser frame with handles at home so there are no DME needs at this time. TOC signing off. ? ?Final next level of care: OP Rehab ?Barriers to Discharge: No Barriers Identified ? ?Patient Goals and CMS Choice ?Patient states their goals for this hospitalization and ongoing recovery are:: Discharge home with OPPT at [facility] ?Choice offered to / list presented to : NA ? ?Discharge Plan and Services      ?DME Arranged: N/A ?DME Agency: NA ? ?Readmission Risk Interventions ?   ? View : No data to display.  ?  ?  ?  ? ?

## 2021-12-31 NOTE — Progress Notes (Signed)
? ?  Subjective: ?1 Day Post-Op Procedure(s) (LRB): ?TOTAL KNEE ARTHROPLASTY (Left) ?Patient reports pain as mild.   ?Patient seen in rounds for Dr. Alvan Dame. ?Patient is well, and has had no acute complaints or problems. No acute events overnight. Foley catheter removed. Patient ambulated 15 feet with PT.  ?We will continue therapy today.  ? ?Objective: ?Vital signs in last 24 hours: ?Temp:  [97.5 ?F (36.4 ?C)-98.1 ?F (36.7 ?C)] 97.7 ?F (36.5 ?C) (04/21 0450) ?Pulse Rate:  [70-87] 70 (04/21 0450) ?Resp:  [12-18] 14 (04/21 0450) ?BP: (113-140)/(43-80) 122/62 (04/21 0450) ?SpO2:  [92 %-98 %] 96 % (04/21 0450) ?Weight:  [79.8 kg] 79.8 kg (04/20 1001) ? ?Intake/Output from previous day: ? ?Intake/Output Summary (Last 24 hours) at 12/31/2021 0848 ?Last data filed at 12/31/2021 0600 ?Gross per 24 hour  ?Intake 2360 ml  ?Output 2800 ml  ?Net -440 ml  ?  ? ?Intake/Output this shift: ?No intake/output data recorded. ? ?Labs: ?Recent Labs  ?  12/31/21 ?0332  ?HGB 12.5  ? ?Recent Labs  ?  12/31/21 ?0332  ?WBC 16.5*  ?RBC 4.00  ?HCT 36.7  ?PLT 221  ? ?Recent Labs  ?  12/31/21 ?0332  ?NA 137  ?K 4.8  ?CL 107  ?CO2 26  ?BUN 20  ?CREATININE 0.73  ?GLUCOSE 131*  ?CALCIUM 8.8*  ? ?No results for input(s): LABPT, INR in the last 72 hours. ? ?Exam: ?General - Patient is Alert and Oriented ?Extremity - Neurologically intact ?Sensation intact distally ?Intact pulses distally ?Dorsiflexion/Plantar flexion intact ?Dressing - dressing C/D/I ?Motor Function - intact, moving foot and toes well on exam.  ? ?Past Medical History:  ?Diagnosis Date  ? Arthritis   ? Asthma   ? currently ruling out asthma  ? Cancer [facility])   ? hx of skin cancer  ? Depression   ? Heart murmur   ? hx of heart murmur never followed by a cardiologist  ? Pneumonia   ? ? ?Assessment/Plan: ?1 Day Post-Op Procedure(s) (LRB): ?TOTAL KNEE ARTHROPLASTY (Left) ?Principal Problem: ?  S/P total knee arthroplasty, left ? ?Estimated body mass index is 30.41 kg/m? as calculated from the  following: ?  Height as of this encounter: 5' 3.78" (1.62 m). ?  Weight as of this encounter: 79.8 kg. ?Advance diet ?Up with therapy ?D/C IV fluids ? ? ?Patient's anticipated LOS is less than 2 midnights, meeting these requirements: ?- Younger than 64 ?- Lives within 1 hour of care ?- Has a competent adult at home to recover with post-op recover ?- NO history of ? - Chronic pain requiring opiods ? - Diabetes ? - Coronary Artery Disease ? - Heart failure ? - Heart attack ? - Stroke ? - DVT/VTE ? - Cardiac arrhythmia ? - Respiratory Failure/COPD ? - Renal failure ? - Anemia ? - Advanced Liver disease ? ?  ? ?DVT Prophylaxis - Aspirin ?Weight bearing as tolerated. ? ?Hgb stable at 12.5 this AM. ? ?Plan is to go Home after hospital stay. Plan for discharge today following 1-2 sessions of PT as long as they are meeting their goals. Patient is scheduled for OPPT. Follow up in the office in 2 weeks.  ? ?Griffith Citron, PA-C ?Orthopedic Surgery ?[phone] ?12/31/2021, 8:48 AM  ?

## 2021-12-31 NOTE — Progress Notes (Addendum)
Physical Therapy Treatment ?Patient Details ?Name: Hannah Charles ?MRN: [id number] ?DOB: [birth year] ?Today's Date: 12/31/2021 ? ? ?History of Present Illness Pt is a 76yo female presenting s/p L-TKA on 12/30/21. PMH: OA, depression, several foot and back surgeries. ? ?  ?PT Comments  ? ? Pt ambulated 125' with RW, no loss of balance. Pt demonstrates good understanding of HEP. She is ready to DC home from a PT standpoint. ?   ?Recommendations for follow up therapy are one component of a multi-disciplinary discharge planning process, led by the attending physician.  Recommendations may be updated based on patient status, additional functional criteria and insurance authorization. ? ?Follow Up Recommendations ? Follow physician's recommendations for discharge plan and follow up therapies ?  ?  ?Assistance Recommended at Discharge Set up Supervision/Assistance  ?Patient can return home with the following A little help with walking and/or transfers;A little help with bathing/dressing/bathroom;Assistance with cooking/housework;Assist for transportation;Help with stairs or ramp for entrance ?  ?Equipment Recommendations ? None recommended by PT  ?  ?Recommendations for Other Services   ? ? ?  ?Precautions / Restrictions Precautions ?Precautions: Fall; knee ?Reviewed no pillow under knee ?Restrictions ?Weight Bearing Restrictions: No ?LLE Weight Bearing: Weight bearing as tolerated  ?  ? ?Mobility ? Bed Mobility ?Overal bed mobility: Modified Independent ?Bed Mobility: Supine to Sit ?  ?  ?Supine to sit: HOB elevated, Modified independent (Device/Increase time) ?  ?  ?General bed mobility comments: no physical assist required, ?  ? ?Transfers ?Overall transfer level: Needs assistance ?Equipment used: Rolling walker (2 wheels) ?Transfers: Sit to/from Stand ?Sit to Stand: Supervision ?  ?  ?  ?  ?  ?General transfer comment: good safety awareness, good hand placement, VCs to extend L knee prior to sitting ?   ? ?Ambulation/Gait ?Ambulation/Gait assistance: Supervision ?Gait Distance (Feet): 125 Feet ?Assistive device: Rolling walker (2 wheels) ?Gait Pattern/deviations: Step-to pattern, Decreased step length - right, Decreased stance time - left ?Gait velocity: decreased ?  ?  ?General Gait Details: steady, did not lose balance, good sequencing ? ? ?Stairs ?  ?  ?  ?  ?  ? ? ?Wheelchair Mobility ?  ? ?Modified Rankin (Stroke Patients Only) ?  ? ? ?  ?Balance Overall balance assessment: Needs assistance ?Sitting-balance support: Feet unsupported, No upper extremity supported ?Sitting balance-Leahy Scale: Good ?  ?  ?Standing balance support: Bilateral upper extremity supported, During functional activity, Reliant on assistive device for balance ?Standing balance-Leahy Scale: Poor ?  ?  ?  ?  ?  ?  ?  ?  ?  ?  ?  ?  ?  ? ?  ?Cognition Arousal/Alertness: Awake/alert ?Behavior During Therapy: [facility] for tasks assessed/performed ?Overall Cognitive Status: Within Functional Limits for tasks assessed ?  ?  ?  ?  ?  ?  ?  ?  ?  ?  ?  ?  ?  ?  ?  ?  ?  ?  ?  ? ?  ?Exercises Total Joint Exercises ?Ankle Circles/Pumps: AROM, Both, 10 reps, Supine ?Quad Sets: AROM, Left, 5 reps, Supine ?Short Arc Quad: AROM, Left, 5 reps, Supine ?Heel Slides: AAROM, Left, 5 reps, Supine ?Hip ABduction/ADduction: AROM, Left, 5 reps, Supine ?Straight Leg Raises: AROM, Left, 5 reps, Supine ?Long Arc Quad: AROM, Left, 5 reps, Seated ?Knee Flexion: AAROM, Left, 5 reps, Seated ?Goniometric ROM: 5-70* AAROM L knee ? ?  ?General Comments   ?  ?  ? ?Pertinent Vitals/Pain Pain  Assessment ?Pain Score: 7  ?Pain Location: L knee ?Pain Descriptors / Indicators: Operative site guarding, Discomfort ?Pain Intervention(s): Limited activity within patient's tolerance, Monitored during session, Premedicated before session, Ice applied  ? ? ?Home Living   ?  ?  ?  ?  ?  ?  ?  ?  ?  ?   ?  ?Prior Function    ?  ?  ?   ? ?PT Goals (current goals can now be found in the care  plan section) Acute Rehab PT Goals ?Patient Stated Goal: play with dogs, return to work as a caregiver ?PT Goal Formulation: With patient ?Time For Goal Achievement: 01/06/22 ?Potential to Achieve Goals: Good ?Progress towards PT goals: Progressing toward goals ? ?  ?Frequency ? ? ? 7X/week ? ? ? ?  ?PT Plan Current plan remains appropriate  ? ? ?Co-evaluation   ?  ?  ?  ?  ? ?  ?AM-PAC PT "6 Clicks" Mobility   ?Outcome Measure ? Help needed turning from your back to your side while in a flat bed without using bedrails?: None ?Help needed moving from lying on your back to sitting on the side of a flat bed without using bedrails?: None ?Help needed moving to and from a bed to a chair (including a wheelchair)?: A Little ?Help needed standing up from a chair using your arms (e.g., wheelchair or bedside chair)?: A Little ?Help needed to walk in hospital room?: A Little ?Help needed climbing 3-5 steps with a railing? : A Little ?6 Click Score: 20 ? ?  ?End of Session Equipment Utilized During Treatment: Gait belt ?Activity Tolerance: Patient tolerated treatment well ?Patient left: in chair;with call bell/phone within reach;with chair alarm set ?Nurse Communication: Mobility status ?PT Visit Diagnosis: Difficulty in walking, not elsewhere classified (R26.2);Pain ?Pain - Right/Left: Left ?Pain - part of body: Knee ?  ? ? ?Time: 1638-4665 ?PT Time Calculation (min) (ACUTE ONLY): 23 min ? ?Charges:  $Gait Training: 8-22 mins ?$Therapeutic Exercise: 8-22 mins          ?          ?Blondell Reveal Kistler PT 12/31/2021  ?Acute Rehabilitation Services ?Pager [phone] ?Office [phone] ? ? ?

## 2022-01-06 NOTE — Discharge Summary (Signed)
Patient ID: ?Shona Simpson ?MRN: [id number] ?DOB/AGE: [birth year] 76 y.o. ? ?Admit date: 12/30/2021 ?Discharge date: 12/31/2021 ? ?Admission Diagnoses:  ?Left knee osteoarthritis ? ?Discharge Diagnoses:  ?Principal Problem: ?  S/P total knee arthroplasty, left ? ? ?Past Medical History:  ?Diagnosis Date  ? Arthritis   ? Asthma   ? currently ruling out asthma  ? Cancer [facility])   ? hx of skin cancer  ? Depression   ? Heart murmur   ? hx of heart murmur never followed by a cardiologist  ? Pneumonia   ? ? ?Surgeries: Procedure(s): ?TOTAL KNEE ARTHROPLASTY on 12/30/2021 ?  ?Consultants:  ? ?Discharged Condition: Improved ? ?Hospital Course: Telecia Larocque is an 76 y.o. female who was admitted 12/30/2021 for operative treatment ofS/P total knee arthroplasty, left. Patient has severe unremitting pain that affects sleep, daily activities, and work/hobbies. After pre-op clearance the patient was taken to the operating room on 12/30/2021 and underwent  Procedure(s): ?TOTAL KNEE ARTHROPLASTY.   ? ?Patient was given perioperative antibiotics:  ?Anti-infectives (From admission, onward)  ? ? Start     Dose/Rate Route Frequency Ordered Stop  ? 12/30/21 1330  ceFAZolin (ANCEF) IVPB 2g/100 mL premix       ? 2 g ?200 mL/hr over 30 Minutes Intravenous Every 6 hours 12/30/21 1001 12/30/21 2115  ? 12/30/21 0600  ceFAZolin (ANCEF) IVPB 2g/100 mL premix       ? 2 g ?200 mL/hr over 30 Minutes Intravenous On call to O.R. 12/30/21 3557 12/30/21 0751  ? ?  ?  ? ?Patient was given sequential compression devices, early ambulation, and chemoprophylaxis to prevent DVT. Patient worked with PT and was meeting their goals regarding safe ambulation and transfers. ? ?Patient benefited maximally from hospital stay and there were no complications.   ? ?Recent vital signs: No data found.  ? ?Recent laboratory studies: No results for input(s): WBC, HGB, HCT, PLT, NA, K, CL, CO2, BUN, CREATININE, GLUCOSE, INR, CALCIUM in the last 72 hours. ? ?Invalid  input(s): PT, 2 ? ? ?Discharge Medications:   ?Allergies as of 12/31/2021   ? ?   Reactions  ? Compazine [prochlorperazine Edisylate] Other (See Comments)  ? Angioedema & Seizures  ? Penicillins Other (See Comments)  ? Seizures /angioedema/ throat swelling ?Tolerated Cephalosporin Date: 12/31/21.  ? Cat Hair Extract Other (See Comments)  ? Chest/head congestion  ? Dog Epithelium Allergy Skin Test   ? Chest/head congestion  ? Molds & Smuts   ? Chest/head congestion  ? ?  ? ?  ?Medication List  ?  ? ?STOP taking these medications   ? ?naproxen sodium 220 MG tablet ?Commonly known as: ALEVE ?  ? ?  ? ?TAKE these medications   ? ?aspirin 81 MG chewable tablet ?Chew 1 tablet (81 mg total) by mouth 2 (two) times daily for 28 days. ?  ?atorvastatin 40 MG tablet ?Commonly known as: LIPITOR ?Take 40 mg by mouth in the morning. ?  ?Azelastine HCl 137 MCG/SPRAY Soln ?USE 2 SPRAYS INTRANASALLY 2 TIMES DAILY in EACH NOSTRIL ?  ?docusate sodium 100 MG capsule ?Commonly known as: COLACE ?Take 1 capsule (100 mg total) by mouth 2 (two) times daily. ?  ?DULoxetine 30 MG capsule ?Commonly known as: CYMBALTA ?Take 30 mg by mouth in the morning. ?  ?EPINEPHrine 0.3 mg/0.3 mL Soaj injection ?Commonly known as: EPI-PEN ?Inject 0.3 mg into the muscle as needed for anaphylaxis. ?  ?famotidine 20 MG tablet ?Commonly known as: PEPCID ?Take 20  mg by mouth at bedtime. ?  ?fexofenadine 180 MG tablet ?Commonly known as: ALLEGRA ?Take 180 mg by mouth every evening. ?  ?fluticasone 50 MCG/ACT nasal spray ?Commonly known as: FLONASE ?Place 1 spray into both nostrils daily. ?  ?gabapentin 300 MG capsule ?Commonly known as: NEURONTIN ?Take 300 mg by mouth at bedtime. ?  ?Lubricant Eye Drops 0.4-0.3 % Soln ?Generic drug: Polyethyl Glycol-Propyl Glycol ?Place 1-2 drops into both eyes 3 (three) times daily as needed (dry/irritated eyes.). ?  ?methocarbamol 500 MG tablet ?Commonly known as: ROBAXIN ?Take 1 tablet (500 mg total) by mouth every 6 (six) hours  as needed for muscle spasms. ?  ?montelukast 10 MG tablet ?Commonly known as: SINGULAIR ?Take 10 mg by mouth at bedtime. ?  ?oxyCODONE 5 MG immediate release tablet ?Commonly known as: Oxy IR/ROXICODONE ?Take 1-2 tablets (5-10 mg total) by mouth every 4 (four) hours as needed for severe pain. ?  ?polyethylene glycol 17 g packet ?Commonly known as: MIRALAX / GLYCOLAX ?Take 17 g by mouth daily as needed for mild constipation. ?  ?potassium chloride SA 20 MEQ tablet ?Commonly known as: KLOR-CON M ?Take 20 mEq by mouth in the morning. ?  ?STERILE LUBRICANT OP ?Place 1 drop into both eyes at bedtime. iVIZIA ?  ?Tylenol PM Extra Strength 25-500 MG Tabs tablet ?Generic drug: diphenhydramine-acetaminophen ?Take 1 tablet by mouth every evening. ?  ?vitamin B-12 1000 MCG tablet ?Commonly known as: CYANOCOBALAMIN ?Take 1,000 mcg by mouth in the morning. ?  ? ?  ? ?  ?  ? ? ?  ?Discharge Care Instructions  ?(From admission, onward)  ?  ? ? ?  ? ?  Start     Ordered  ? 12/31/21 0000  Change dressing       ?Comments: Maintain surgical dressing until follow up in the clinic. If the edges start to pull up, may reinforce with tape. If the dressing is no longer working, may remove and cover with gauze and tape, but must keep the area dry and clean.  Call with any questions or concerns.  ? 12/31/21 0853  ? ?  ?  ? ?  ? ? ?Diagnostic Studies: No results found. ? ?Disposition: Discharge disposition: 01-Home or Self Care ? ? ? ? ? ? ?Discharge Instructions   ? ? Call MD / Call 911   Complete by: As directed ?  ? If you experience chest pain or shortness of breath, CALL 911 and be transported to the hospital emergency room.  If you develope a fever above 101 F, pus (white drainage) or increased drainage or redness at the wound, or calf pain, call your surgeon's office.  ? Change dressing   Complete by: As directed ?  ? Maintain surgical dressing until follow up in the clinic. If the edges start to pull up, may reinforce with tape. If the  dressing is no longer working, may remove and cover with gauze and tape, but must keep the area dry and clean.  Call with any questions or concerns.  ? Constipation Prevention   Complete by: As directed ?  ? Drink plenty of fluids.  Prune juice may be helpful.  You may use a stool softener, such as Colace (over the counter) 100 mg twice a day.  Use MiraLax (over the counter) for constipation as needed.  ? Diet - low sodium heart healthy   Complete by: As directed ?  ? Increase activity slowly as tolerated   Complete by: As  directed ?  ? Weight bearing as tolerated with assist device (walker, cane, etc) as directed, use it as long as suggested by your surgeon or therapist, typically at least 4-6 weeks.  ? Post-operative opioid taper instructions:   Complete by: As directed ?  ? POST-OPERATIVE OPIOID TAPER INSTRUCTIONS: ?It is important to wean off of your opioid medication as soon as possible. If you do not need pain medication after your surgery it is ok to stop day one. ?Opioids include: ?Codeine, Hydrocodone(Norco, Vicodin), Oxycodone(Percocet, oxycontin) and hydromorphone amongst others.  ?Long term and even short term use of opiods can cause: ?Increased pain response ?Dependence ?Constipation ?Depression ?Respiratory depression ?And more.  ?Withdrawal symptoms can include ?Flu like symptoms ?Nausea, vomiting ?And more ?Techniques to manage these symptoms ?Hydrate well ?Eat regular healthy meals ?Stay active ?Use relaxation techniques(deep breathing, meditating, yoga) ?Do Not substitute Alcohol to help with tapering ?If you have been on opioids for less than two weeks and do not have pain than it is ok to stop all together.  ?Plan to wean off of opioids ?This plan should start within one week post op of your joint replacement. ?Maintain the same interval or time between taking each dose and first decrease the dose.  ?Cut the total daily intake of opioids by one tablet each day ?Next start to increase the time  between doses. ?The last dose that should be eliminated is the evening dose.  ? ?  ? TED hose   Complete by: As directed ?  ? Use stockings (TED hose) for 2 weeks on both leg(s).  You may remove them at night f

## 2022-07-01 ENCOUNTER — Other Ambulatory Visit: Payer: Self-pay | Admitting: Family Medicine

## 2022-07-01 DIAGNOSIS — Z1231 Encounter for screening mammogram for malignant neoplasm of breast: Secondary | ICD-10-CM

## 2022-07-05 ENCOUNTER — Other Ambulatory Visit: Payer: Self-pay | Admitting: Family Medicine

## 2022-07-05 DIAGNOSIS — Z780 Asymptomatic menopausal state: Secondary | ICD-10-CM

## 2022-07-05 DIAGNOSIS — Z13820 Encounter for screening for osteoporosis: Secondary | ICD-10-CM

## 2022-08-24 ENCOUNTER — Ambulatory Visit
Admission: RE | Admit: 2022-08-24 | Discharge: 2022-08-24 | Disposition: A | Discharge status: Home / Self Care | Payer: Medicare Other | Source: Ambulatory Visit | Attending: Family Medicine | Admitting: Family Medicine

## 2022-08-24 DIAGNOSIS — Z1231 Encounter for screening mammogram for malignant neoplasm of breast: Secondary | ICD-10-CM

## 2022-08-26 ENCOUNTER — Encounter: Payer: Self-pay | Admitting: Family Medicine

## 2022-08-26 ENCOUNTER — Other Ambulatory Visit: Payer: Self-pay | Admitting: Family Medicine

## 2022-08-26 DIAGNOSIS — R928 Other abnormal and inconclusive findings on diagnostic imaging of breast: Secondary | ICD-10-CM

## 2022-11-16 ENCOUNTER — Other Ambulatory Visit: Payer: Self-pay | Admitting: Family Medicine

## 2022-11-16 ENCOUNTER — Ambulatory Visit
Admission: RE | Admit: 2022-11-16 | Discharge: 2022-11-16 | Disposition: A | Discharge status: Home / Self Care | Payer: Medicare Other | Source: Ambulatory Visit | Attending: Family Medicine | Admitting: Family Medicine

## 2022-11-16 DIAGNOSIS — R928 Other abnormal and inconclusive findings on diagnostic imaging of breast: Secondary | ICD-10-CM

## 2022-11-16 DIAGNOSIS — N6489 Other specified disorders of breast: Secondary | ICD-10-CM

## 2022-11-30 ENCOUNTER — Ambulatory Visit
Admission: RE | Admit: 2022-11-30 | Discharge: 2022-11-30 | Disposition: A | Discharge status: Home / Self Care | Payer: Medicare Other | Source: Ambulatory Visit | Attending: Family Medicine | Admitting: Family Medicine

## 2022-11-30 DIAGNOSIS — N6489 Other specified disorders of breast: Secondary | ICD-10-CM

## 2022-11-30 DIAGNOSIS — R928 Other abnormal and inconclusive findings on diagnostic imaging of breast: Secondary | ICD-10-CM

## 2022-11-30 HISTORY — PX: BREAST BIOPSY: SHX20

## 2022-12-21 ENCOUNTER — Ambulatory Visit: Payer: Self-pay | Admitting: General Surgery

## 2022-12-21 DIAGNOSIS — N6322 Unspecified lump in the left breast, upper inner quadrant: Secondary | ICD-10-CM

## 2022-12-21 MED ORDER — KETOROLAC TROMETHAMINE 15 MG/ML IJ SOLN: 15.0000 mg | Freq: Once | INTRAMUSCULAR | Status: AC

## 2022-12-23 ENCOUNTER — Other Ambulatory Visit: Payer: Self-pay | Admitting: General Surgery

## 2022-12-23 DIAGNOSIS — N6322 Unspecified lump in the left breast, upper inner quadrant: Secondary | ICD-10-CM

## 2022-12-27 ENCOUNTER — Other Ambulatory Visit: Payer: Medicare Other

## 2022-12-27 ENCOUNTER — Encounter: Payer: Medicare Other | Admitting: Genetic Counselor

## 2022-12-28 ENCOUNTER — Other Ambulatory Visit: Payer: Medicare Other

## 2023-01-16 ENCOUNTER — Other Ambulatory Visit: Payer: Self-pay

## 2023-01-16 ENCOUNTER — Encounter (HOSPITAL_BASED_OUTPATIENT_CLINIC_OR_DEPARTMENT_OTHER): Payer: Self-pay | Admitting: General Surgery

## 2023-01-19 ENCOUNTER — Ambulatory Visit
Admission: RE | Admit: 2023-01-19 | Discharge: 2023-01-19 | Disposition: A | Discharge status: Home / Self Care | Payer: Medicare Other | Source: Ambulatory Visit | Attending: General Surgery | Admitting: General Surgery

## 2023-01-19 DIAGNOSIS — N6322 Unspecified lump in the left breast, upper inner quadrant: Secondary | ICD-10-CM

## 2023-01-19 HISTORY — PX: BREAST BIOPSY: SHX20

## 2023-01-19 MED ORDER — CHLORHEXIDINE GLUCONATE CLOTH 2 % EX PADS: 6.0000 | MEDICATED_PAD | Freq: Once | CUTANEOUS | Status: DC

## 2023-01-19 NOTE — Progress Notes (Signed)

## 2023-01-20 ENCOUNTER — Ambulatory Visit (HOSPITAL_BASED_OUTPATIENT_CLINIC_OR_DEPARTMENT_OTHER): Payer: Medicare Other | Admitting: Certified Registered"

## 2023-01-20 ENCOUNTER — Other Ambulatory Visit: Payer: Self-pay

## 2023-01-20 ENCOUNTER — Ambulatory Visit
Admission: RE | Admit: 2023-01-20 | Discharge: 2023-01-20 | Disposition: A | Discharge status: Home / Self Care | Payer: Medicare Other | Source: Ambulatory Visit | Attending: General Surgery | Admitting: General Surgery

## 2023-01-20 ENCOUNTER — Ambulatory Visit (HOSPITAL_BASED_OUTPATIENT_CLINIC_OR_DEPARTMENT_OTHER)
Admission: RE | Admit: 2023-01-20 | Discharge: 2023-01-20 | Disposition: A | Discharge status: Home / Self Care | Payer: Medicare Other | Attending: General Surgery | Admitting: General Surgery

## 2023-01-20 ENCOUNTER — Encounter (HOSPITAL_BASED_OUTPATIENT_CLINIC_OR_DEPARTMENT_OTHER)
Admission: RE | Disposition: A | Discharge status: Home / Self Care | Payer: Self-pay | Source: Home / Self Care | Attending: General Surgery

## 2023-01-20 ENCOUNTER — Encounter (HOSPITAL_BASED_OUTPATIENT_CLINIC_OR_DEPARTMENT_OTHER): Payer: Self-pay | Admitting: General Surgery

## 2023-01-20 DIAGNOSIS — N6320 Unspecified lump in the left breast, unspecified quadrant: Secondary | ICD-10-CM | POA: Insufficient documentation

## 2023-01-20 DIAGNOSIS — J45909 Unspecified asthma, uncomplicated: Secondary | ICD-10-CM | POA: Insufficient documentation

## 2023-01-20 DIAGNOSIS — Z803 Family history of malignant neoplasm of breast: Secondary | ICD-10-CM | POA: Diagnosis not present

## 2023-01-20 DIAGNOSIS — N6322 Unspecified lump in the left breast, upper inner quadrant: Secondary | ICD-10-CM | POA: Insufficient documentation

## 2023-01-20 DIAGNOSIS — Z87891 Personal history of nicotine dependence: Secondary | ICD-10-CM | POA: Diagnosis not present

## 2023-01-20 DIAGNOSIS — M1990 Unspecified osteoarthritis, unspecified site: Secondary | ICD-10-CM | POA: Diagnosis not present

## 2023-01-20 DIAGNOSIS — Z09 Encounter for follow-up examination after completed treatment for conditions other than malignant neoplasm: Secondary | ICD-10-CM | POA: Insufficient documentation

## 2023-01-20 DIAGNOSIS — Z01818 Encounter for other preprocedural examination: Secondary | ICD-10-CM

## 2023-01-20 HISTORY — PX: BREAST LUMPECTOMY WITH RADIOACTIVE SEED LOCALIZATION: SHX6424

## 2023-01-20 SURGERY — BREAST LUMPECTOMY WITH RADIOACTIVE SEED LOCALIZATION
Anesthesia: General | Site: Breast | Laterality: Left

## 2023-01-20 MED ORDER — PHENYLEPHRINE 80 MCG/ML (10ML) SYRINGE FOR IV PUSH (FOR BLOOD PRESSURE SUPPORT)
PREFILLED_SYRINGE | INTRAVENOUS | Status: AC
  Filled 2023-01-20: qty 10

## 2023-01-20 MED ORDER — PROPOFOL 10 MG/ML IV BOLUS
INTRAVENOUS | Status: AC
  Filled 2023-01-20: qty 20

## 2023-01-20 MED ORDER — FENTANYL CITRATE (PF) 100 MCG/2ML IJ SOLN: 25.0000 ug | INTRAMUSCULAR | Status: DC | PRN

## 2023-01-20 MED ORDER — OXYCODONE HCL 5 MG PO TABS
5.0000 mg | ORAL_TABLET | Freq: Four times a day (QID) | ORAL | 0 refills | Status: AC | PRN
Start: 2023-01-20 — End: ?

## 2023-01-20 MED ORDER — LACTATED RINGERS IV SOLN: INTRAVENOUS | Status: DC

## 2023-01-20 MED ORDER — LIDOCAINE HCL (CARDIAC) PF 100 MG/5ML IV SOSY
PREFILLED_SYRINGE | INTRAVENOUS | Status: DC | PRN
  Administered 2023-01-20: 60 mg via INTRAVENOUS

## 2023-01-20 MED ORDER — VANCOMYCIN HCL IN DEXTROSE 1-5 GM/200ML-% IV SOLN
1000.0000 mg | INTRAVENOUS | Status: AC
  Administered 2023-01-20: 1000 mg via INTRAVENOUS

## 2023-01-20 MED ORDER — PROPOFOL 500 MG/50ML IV EMUL
INTRAVENOUS | Status: DC | PRN
  Administered 2023-01-20: 25 ug/kg/min via INTRAVENOUS

## 2023-01-20 MED ORDER — VANCOMYCIN HCL IN DEXTROSE 1-5 GM/200ML-% IV SOLN
INTRAVENOUS | Status: AC
  Filled 2023-01-20: qty 200

## 2023-01-20 MED ORDER — ONDANSETRON HCL 4 MG/2ML IJ SOLN
INTRAMUSCULAR | Status: DC | PRN
  Administered 2023-01-20: 4 mg via INTRAVENOUS

## 2023-01-20 MED ORDER — FENTANYL CITRATE (PF) 100 MCG/2ML IJ SOLN
INTRAMUSCULAR | Status: DC | PRN
  Administered 2023-01-20 (×2): 50 ug via INTRAVENOUS

## 2023-01-20 MED ORDER — FENTANYL CITRATE (PF) 100 MCG/2ML IJ SOLN
INTRAMUSCULAR | Status: AC
  Filled 2023-01-20: qty 2

## 2023-01-20 MED ORDER — ACETAMINOPHEN 500 MG PO TABS
ORAL_TABLET | ORAL | Status: AC
  Filled 2023-01-20: qty 2

## 2023-01-20 MED ORDER — CELECOXIB 200 MG PO CAPS
200.0000 mg | ORAL_CAPSULE | Freq: Once | ORAL | Status: AC
  Administered 2023-01-20: 200 mg via ORAL

## 2023-01-20 MED ORDER — CELECOXIB 200 MG PO CAPS
ORAL_CAPSULE | ORAL | Status: AC
  Filled 2023-01-20: qty 1

## 2023-01-20 MED ORDER — AMISULPRIDE (ANTIEMETIC) 5 MG/2ML IV SOLN: 10.0000 mg | Freq: Once | INTRAVENOUS | Status: DC | PRN

## 2023-01-20 MED ORDER — GABAPENTIN 100 MG PO CAPS
ORAL_CAPSULE | ORAL | Status: AC
  Filled 2023-01-20: qty 1

## 2023-01-20 MED ORDER — BUPIVACAINE-EPINEPHRINE 0.5% -1:200000 IJ SOLN
INTRAMUSCULAR | Status: DC | PRN
  Administered 2023-01-20: 20 mL

## 2023-01-20 MED ORDER — PHENYLEPHRINE HCL (PRESSORS) 10 MG/ML IV SOLN
INTRAVENOUS | Status: DC | PRN
  Administered 2023-01-20: 80 ug via INTRAVENOUS

## 2023-01-20 MED ORDER — ONDANSETRON HCL 4 MG/2ML IJ SOLN: 4.0000 mg | Freq: Once | INTRAMUSCULAR | Status: DC | PRN

## 2023-01-20 MED ORDER — GABAPENTIN 100 MG PO CAPS
100.0000 mg | ORAL_CAPSULE | ORAL | Status: AC
  Administered 2023-01-20: 100 mg via ORAL

## 2023-01-20 MED ORDER — PROPOFOL 10 MG/ML IV BOLUS
INTRAVENOUS | Status: DC | PRN
  Administered 2023-01-20: 120 mg via INTRAVENOUS

## 2023-01-20 MED ORDER — ACETAMINOPHEN 500 MG PO TABS
1000.0000 mg | ORAL_TABLET | ORAL | Status: AC
  Administered 2023-01-20: 1000 mg via ORAL

## 2023-01-20 SURGICAL SUPPLY — 39 items
ADH SKN CLS APL DERMABOND .7 (GAUZE/BANDAGES/DRESSINGS) ×1
APL PRP STRL LF DISP 70% ISPRP (MISCELLANEOUS) ×1
APPLIER CLIP 9.375 MED OPEN (MISCELLANEOUS)
APR CLP MED 9.3 20 MLT OPN (MISCELLANEOUS)
BLADE SURG 15 STRL LF DISP TIS (BLADE) ×1 IMPLANT
BLADE SURG 15 STRL SS (BLADE) ×1
CANISTER SUC SOCK COL 7IN (MISCELLANEOUS) ×1 IMPLANT
CANISTER SUCT 1200ML W/VALVE (MISCELLANEOUS) ×1 IMPLANT
CHLORAPREP W/TINT 26 (MISCELLANEOUS) ×1 IMPLANT
CLIP APPLIE 9.375 MED OPEN (MISCELLANEOUS) IMPLANT
COVER BACK TABLE 60X90IN (DRAPES) ×1 IMPLANT
COVER MAYO STAND STRL (DRAPES) ×1 IMPLANT
COVER PROBE CYLINDRICAL 5X96 (MISCELLANEOUS) ×1 IMPLANT
DERMABOND ADVANCED .7 DNX12 (GAUZE/BANDAGES/DRESSINGS) ×1 IMPLANT
DRAPE LAPAROSCOPIC ABDOMINAL (DRAPES) ×1 IMPLANT
DRAPE UTILITY XL STRL (DRAPES) ×1 IMPLANT
ELECT COATED BLADE 2.86 ST (ELECTRODE) ×1 IMPLANT
ELECT REM PT RETURN 9FT ADLT (ELECTROSURGICAL) ×1
ELECTRODE REM PT RTRN 9FT ADLT (ELECTROSURGICAL) ×1 IMPLANT
GLOVE BIO SURGEON STRL SZ7.5 (GLOVE) ×2 IMPLANT
GOWN STRL REUS W/ TWL LRG LVL3 (GOWN DISPOSABLE) ×2 IMPLANT
GOWN STRL REUS W/TWL LRG LVL3 (GOWN DISPOSABLE) ×2
KIT MARKER MARGIN INK (KITS) ×1 IMPLANT
NDL HYPO 25X1 1.5 SAFETY (NEEDLE) IMPLANT
NEEDLE HYPO 25X1 1.5 SAFETY (NEEDLE) IMPLANT
NS IRRIG 1000ML POUR BTL (IV SOLUTION) IMPLANT
PACK BASIN DAY SURGERY FS (CUSTOM PROCEDURE TRAY) ×1 IMPLANT
PENCIL SMOKE EVACUATOR (MISCELLANEOUS) ×1 IMPLANT
SLEEVE SCD COMPRESS KNEE MED (STOCKING) ×1 IMPLANT
SPIKE FLUID TRANSFER (MISCELLANEOUS) IMPLANT
SPONGE T-LAP 18X18 ~~LOC~~+RFID (SPONGE) ×1 IMPLANT
SUT MON AB 4-0 PC3 18 (SUTURE) ×1 IMPLANT
SUT SILK 2 0 SH (SUTURE) IMPLANT
SUT VICRYL 3-0 CR8 SH (SUTURE) ×1 IMPLANT
SYR CONTROL 10ML LL (SYRINGE) IMPLANT
TOWEL GREEN STERILE FF (TOWEL DISPOSABLE) ×1 IMPLANT
TRAY FAXITRON CT DISP (TRAY / TRAY PROCEDURE) ×1 IMPLANT
TUBE CONNECTING 20X1/4 (TUBING) ×1 IMPLANT
YANKAUER SUCT BULB TIP NO VENT (SUCTIONS) IMPLANT

## 2023-01-20 NOTE — Op Note (Signed)
01/20/2023  12:09 PM  PATIENT:  Hannah Charles  77 y.o. female  PRE-OPERATIVE DIAGNOSIS:  LEFT BREAST MASS  POST-OPERATIVE DIAGNOSIS:  LEFT BREAST MASS  PROCEDURE:  Procedure(s): LEFT BREAST LUMPECTOMY WITH RADIOACTIVE SEED LOCALIZATION (Left)  SURGEON:  Surgeon(s) and Role:    Griselda Miner, MD - Primary  PHYSICIAN ASSISTANT:   ASSISTANTS: none   ANESTHESIA:   local and general  EBL:  minimal   BLOOD ADMINISTERED:none  DRAINS: none   LOCAL MEDICATIONS USED:  MARCAINE     SPECIMEN:  Source of Specimen:  left breast tissue  DISPOSITION OF SPECIMEN:  PATHOLOGY  COUNTS:  YES  TOURNIQUET:  * No tourniquets in log *  DICTATION: .Dragon Dictation  After informed consent was obtained the patient was brought to the operating room and placed in the supine position on the operating table.  After adequate induction of general anesthesia the patient's left breast was prepped with ChloraPrep, allowed to dry, and draped in usual sterile manner.  An appropriate timeout was performed.  Previously an I-125 seed was placed in the upper inner quadrant centrally of the left breast to mark an area of discordant mass.  The neoprobe was set to I-125 in the area of radioactivity was readily identified.  The area around this was infiltrated with quarter percent Marcaine.  A curvilinear incision was made along the upper inner edge of the areola of the left breast with a 15 blade knife.  The incision was carried through the skin and subcutaneous tissue sharply with the electrocautery.  Dissection was then carried towards the radioactive seed under the direction of the neoprobe.  Once I more closely approach the radioactive seed I then removed a circular portion of breast tissue sharply with the electrocautery around the radioactive seed while checking the area of radioactivity frequently.  Once the specimen was removed it was oriented with the appropriate paint colors.  A specimen radiograph was  obtained that showed the clip and seed to be near the center of the specimen.  The specimen was then sent to pathology for further evaluation.  Hemostasis was achieved using the Bovie electrocautery.  The wound was irrigated with saline and infiltrated with more quarter percent Marcaine.  The deep layer of the wound was then closed with interrupted 3-0 Vicryl stitches.  The skin was closed with interrupted 4-0 Monocryl subcuticular stitches.  Dermabond dressings were applied.  The patient tolerated the procedure well.  At the end of the case all needle sponge and instrument counts were correct.  The patient was then awakened and taken to recovery in stable condition.  PLAN OF CARE: Discharge to home after PACU  PATIENT DISPOSITION:  PACU - hemodynamically stable.   Delay start of Pharmacological VTE agent (>24hrs) due to surgical blood loss or risk of bleeding: not applicable

## 2023-01-20 NOTE — H&P (Signed)
REFERRING PHYSICIAN: Melvenia Beam,* PROVIDER: Lindell Noe, MD MRN: [id number] DOB: [birth year] Subjective  Chief Complaint: New Consultation (LEFT breast discordant biopsy)  History of Present Illness: Hannah Charles is a 77 y.o. female who is seen today as an office consultation for evaluation of New Consultation (LEFT breast discordant biopsy)  We are asked to see the patient in consultation by Dr. Moshe Salisbury to evaluate her for a left breast mass. The patient is a 77 year old white female who recently went for a routine screening mammogram. At that time she was found to have a 7 mm mass in the inner aspect of the left breast. This was biopsied and came back benign breast tissue. The radiologist felt that this was discordant. They recommend excision of the area. She is otherwise in pretty good health. She quit smoking in 1998. She does have a family history of breast cancer in her sister and niece. She states that they both tested positive for a breast cancer gene. She has never been tested.  Review of Systems: A complete review of systems was obtained from the patient. I have reviewed this information and discussed as appropriate with the patient. See HPI as well for other ROS.  ROS  Medical History: Past Medical History: Diagnosis Date Arthritis Asthma, unspecified asthma severity, unspecified whether complicated, unspecified whether persistent ([facility])  Patient Active Problem List Diagnosis Mass of upper inner quadrant of left breast  Past Surgical History: Procedure Laterality Date ARTHROPLASTY TOTAL KNEE   Allergies Allergen Reactions Penicillins Other (See Comments) Seizures /angioedema/ throat swelling Tolerated Cephalosporin Date: 12/31/21. Prochlorperazine Edisylate Other (See Comments) Angioedema & Seizures Allergenic Extracts Unknown Chest/head congestion Dog Dander Unknown Chest/head congestion Mold Unknown Chest/head congestion  Current  Outpatient Medications on File Prior to Visit Medication Sig Dispense Refill atorvastatin (LIPITOR) 40 MG tablet Take by mouth azelastine (ASTELIN) 137 mcg nasal spray USE 1 TO 2 SPRAYS IN EACH NOSTRIL 2 TIMES DAILY AS NEEDED famotidine (PEPCID) 20 MG tablet Take 20 mg by mouth once daily fexofenadine (ALLEGRA) 180 MG tablet TAKE 1 TABLET BY MOUTH EVERY DAY SWALLOW WHOLE WITH WATER DO NOT TAKE WITH FRUIT JUICES 30 montelukast (SINGULAIR) 10 mg tablet Take 10 mg by mouth every evening  No current facility-administered medications on file prior to visit.  Family History Problem Relation Age of Onset Breast cancer Sister   Social History  Tobacco Use Smoking Status Former Types: Cigarettes Smokeless Tobacco Never   Social History  Socioeconomic History Marital status: Single Tobacco Use Smoking status: Former Types: Cigarettes Smokeless tobacco: Never Substance and Sexual Activity Alcohol use: Yes Drug use: Never  Objective:  Vitals: BP: 130/76 Pulse: 91 Temp: 36.7 C (98 F) SpO2: 98% Weight: 83.9 kg (185 lb) Height: 162.6 cm (5\' 4" )  Body mass index is 31.76 kg/m.  Physical Exam Vitals reviewed. Constitutional: General: She is not in acute distress. Appearance: Normal appearance. HENT: Head: Normocephalic and atraumatic. Right Ear: External ear normal. Left Ear: External ear normal. Nose: Nose normal. Mouth/Throat: Mouth: Mucous membranes are moist. Pharynx: Oropharynx is clear. Eyes: General: No scleral icterus. Extraocular Movements: Extraocular movements intact. Conjunctiva/sclera: Conjunctivae normal. Pupils: Pupils are equal, round, and reactive to light. Cardiovascular: Rate and Rhythm: Normal rate and regular rhythm. Pulses: Normal pulses. Heart sounds: Murmur heard. Pulmonary: Effort: Pulmonary effort is normal. No respiratory distress. Breath sounds: Normal breath sounds. Abdominal: General: Bowel sounds are normal. Palpations:  Abdomen is soft. Tenderness: There is no abdominal tenderness. Musculoskeletal: General: No swelling, tenderness or deformity. Normal  range of motion. Cervical back: Normal range of motion and neck supple. Skin: General: Skin is warm and dry. Coloration: Skin is not jaundiced. Neurological: General: No focal deficit present. Mental Status: She is alert and oriented to person, place, and time. Psychiatric: Mood and Affect: Mood normal. Behavior: Behavior normal.    Breast: There is no palpable mass in either breast. There is no palpable axillary, supraclavicular, or cervical lymphadenopathy.  Labs, Imaging and Diagnostic Testing:  Assessment and Plan:  Diagnoses and all orders for this visit:  Mass of upper inner quadrant of left breast - Ambulatory Referral to Oncology-Medical - Ambulatory Referral to Cancer Genetics - [facility]   The patient appears to have a 7 mm mass that was felt to be discordant in the inner aspect of the left breast. Because of this the recommendation is to have this area removed and I would agree with this. I have discussed with her in detail the risks and benefits of the operation as well as some of the technical aspects including use of a radioactive seed for localization and she understands and wishes to proceed. We will begin surgical scheduling. Given her family history I will also refer her to the high risk clinic at the cancer center for genetics evaluation.

## 2023-01-20 NOTE — Transfer of Care (Signed)
Immediate Anesthesia Transfer of Care Note  Patient: Hannah Charles  Procedure(s) Performed: LEFT BREAST LUMPECTOMY WITH RADIOACTIVE SEED LOCALIZATION (Left: Breast)  Patient Location: PACU  Anesthesia Type:General  Level of Consciousness: awake, alert , oriented, and patient cooperative  Airway & Oxygen Therapy: Patient Spontanous Breathing and Patient connected to face mask oxygen  Post-op Assessment: Report given to RN and Post -op Vital signs reviewed and stable  Post vital signs: Reviewed and stable  Last Vitals:  Vitals Value Taken Time  BP    Temp    Pulse 96 01/20/23 1215  Resp    SpO2 100 % 01/20/23 1215  Vitals shown include unvalidated device data.  Last Pain:  Vitals:   01/20/23 1048  TempSrc: Oral  PainSc: 0-No pain         Complications: No notable events documented.

## 2023-01-20 NOTE — Anesthesia Postprocedure Evaluation (Signed)
Anesthesia Post Note  Patient: Hannah Charles  Procedure(s) Performed: LEFT BREAST LUMPECTOMY WITH RADIOACTIVE SEED LOCALIZATION (Left: Breast)     Patient location during evaluation: PACU Anesthesia Type: General Level of consciousness: awake Pain management: pain level controlled Vital Signs Assessment: post-procedure vital signs reviewed and stable Respiratory status: spontaneous breathing, nonlabored ventilation and respiratory function stable Cardiovascular status: blood pressure returned to baseline and stable Postop Assessment: no apparent nausea or vomiting Anesthetic complications: no   No notable events documented.  Last Vitals:  Vitals:   01/20/23 1230 01/20/23 1245  BP: 132/82 (!) 143/70  Pulse: 90 87  Resp: (!) 22 20  Temp:  (!) 36.2 C  SpO2: 97% 98%    Last Pain:  Vitals:   01/20/23 1245  TempSrc:   PainSc: 0-No pain                 Michaelia Beilfuss P [place]

## 2023-01-20 NOTE — Discharge Instructions (Signed)
  Post Anesthesia Home Care Instructions  Activity: Get plenty of rest for the remainder of the day. A responsible individual must stay with you for 24 hours following the procedure.  For the next 24 hours, DO NOT: -Drive a car -Advertising copywriter -Drink alcoholic beverages -Take any medication unless instructed by your physician -Make any legal decisions or sign important papers.  Meals: Start with liquid foods such as gelatin or soup. Progress to regular foods as tolerated. Avoid greasy, spicy, heavy foods. If nausea and/or vomiting occur, drink only clear liquids until the nausea and/or vomiting subsides. Call your physician if vomiting continues.  Special Instructions/Symptoms: Your throat may feel dry or sore from the anesthesia or the breathing tube placed in your throat during surgery. If this causes discomfort, gargle with warm salt water. The discomfort should disappear within 24 hours.  If you had a scopolamine patch placed behind your ear for the management of post- operative nausea and/or vomiting:  1. The medication in the patch is effective for 72 hours, after which it should be removed.  Wrap patch in a tissue and discard in the trash. Wash hands thoroughly with soap and water. 2. You may remove the patch earlier than 72 hours if you experience unpleasant side effects which may include dry mouth, dizziness or visual disturbances. 3. Avoid touching the patch. Wash your hands with soap and water after contact with the patch.    Post Anesthesia Home Care Instructions  Activity: Get plenty of rest for the remainder of the day. A responsible individual must stay with you for 24 hours following the procedure.  For the next 24 hours, DO NOT: -Drive a car -Advertising copywriter -Drink alcoholic beverages -Take any medication unless instructed by your physician -Make any legal decisions or sign important papers.  Meals: Start with liquid foods such as gelatin or soup. Progress to  regular foods as tolerated. Avoid greasy, spicy, heavy foods. If nausea and/or vomiting occur, drink only clear liquids until the nausea and/or vomiting subsides. Call your physician if vomiting continues.  Special Instructions/Symptoms: Your throat may feel dry or sore from the anesthesia or the breathing tube placed in your throat during surgery. If this causes discomfort, gargle with warm salt water. The discomfort should disappear within 24 hours.  If you had a scopolamine patch placed behind your ear for the management of post- operative nausea and/or vomiting:  1. The medication in the patch is effective for 72 hours, after which it should be removed.  Wrap patch in a tissue and discard in the trash. Wash hands thoroughly with soap and water. 2. You may remove the patch earlier than 72 hours if you experience unpleasant side effects which may include dry mouth, dizziness or visual disturbances. 3. Avoid touching the patch. Wash your hands with soap and water after contact with the patch.     Nest dose of tylenol due around 4:58pm Nest dose of ibuprofen (Advil,Aleve, Motrin) due around 5pm

## 2023-01-20 NOTE — Anesthesia Preprocedure Evaluation (Addendum)
Anesthesia Evaluation  Patient identified by MRN, date of birth, ID band Patient awake    Reviewed: Allergy & Precautions, NPO status , Patient's Chart, lab work & pertinent test results  Airway Mallampati: II  TM Distance: >3 FB Neck ROM: Full    Dental  (+) Missing   Pulmonary asthma , former smoker   Pulmonary exam normal        Cardiovascular negative cardio ROS Normal cardiovascular exam     Neuro/Psych  PSYCHIATRIC DISORDERS  Depression    negative neurological ROS     GI/Hepatic negative GI ROS, Neg liver ROS,,,  Endo/Other  negative endocrine ROS    Renal/GU negative Renal ROS     Musculoskeletal  (+) Arthritis ,  Left breast mass   Abdominal   Peds  Hematology negative hematology ROS (+) Lab Results      Component                Value               Date                      WBC                      16.5 (H)            12/31/2021                HGB                      12.5                12/31/2021                HCT                      36.7                12/31/2021                MCV                      91.8                12/31/2021                PLT                      221                 12/31/2021             Lab Results      Component                Value               Date                      NA                       137                 12/31/2021                K  4.8                 12/31/2021                CO2                      26                  12/31/2021                GLUCOSE                  131 (H)             12/31/2021                BUN                      20                  12/31/2021                CREATININE               0.73                12/31/2021                CALCIUM                  8.8 (L)             12/31/2021                GFRNONAA                 >60                 12/31/2021              Anesthesia Other Findings LEFT BREAST  MASS  Reproductive/Obstetrics                             Anesthesia Physical Anesthesia Plan  ASA: 2  Anesthesia Plan: General   Post-op Pain Management: Celebrex PO (pre-op)* and Tylenol PO (pre-op)*   Induction: Intravenous  PONV Risk Score and Plan: 3 and Ondansetron, Dexamethasone and Treatment may vary due to age or medical condition  Airway Management Planned: LMA  Additional Equipment:   Intra-op Plan:   Post-operative Plan: Extubation in OR  Informed Consent: I have reviewed the patients History and Physical, chart, labs and discussed the procedure including the risks, benefits and alternatives for the proposed anesthesia with the patient or authorized representative who has indicated his/her understanding and acceptance.     Dental advisory given  Plan Discussed with: CRNA  Anesthesia Plan Comments:        Anesthesia Quick Evaluation

## 2023-01-20 NOTE — Interval H&P Note (Signed)
History and Physical Interval Note:  01/20/2023 10:54 AM  Hannah Charles  has presented today for surgery, with the diagnosis of LEFT BREAST MASS.  The various methods of treatment have been discussed with the patient and family. After consideration of risks, benefits and other options for treatment, the patient has consented to  Procedure(s): LEFT BREAST LUMPECTOMY WITH RADIOACTIVE SEED LOCALIZATION (Left) as a surgical intervention.  The patient's history has been reviewed, patient examined, no change in status, stable for surgery.  I have reviewed the patient's chart and labs.  Questions were answered to the patient's satisfaction.     [name]

## 2023-01-20 NOTE — Anesthesia Procedure Notes (Signed)
Procedure Name: LMA Insertion Date/Time: 01/20/2023 11:32 AM  Performed by: Keyleigh Manninen, Jewel Baize, CRNAPre-anesthesia Checklist: Patient identified, Emergency Drugs available, Suction available and Patient being monitored Patient Re-evaluated:Patient Re-evaluated prior to induction Oxygen Delivery Method: Circle system utilized Preoxygenation: Pre-oxygenation with 100% oxygen Induction Type: IV induction Ventilation: Mask ventilation without difficulty LMA: LMA inserted LMA Size: 4.0 Number of attempts: 1 Airway Equipment and Method: Bite block Placement Confirmation: positive ETCO2 Tube secured with: Tape Dental Injury: Teeth and Oropharynx as per pre-operative assessment

## 2023-01-23 ENCOUNTER — Encounter (HOSPITAL_BASED_OUTPATIENT_CLINIC_OR_DEPARTMENT_OTHER): Payer: Self-pay | Admitting: General Surgery

## 2023-01-23 LAB — SURGICAL PATHOLOGY

## 2023-07-03 ENCOUNTER — Other Ambulatory Visit: Payer: Medicare Other
